# Patient Record
Sex: Female | Born: 1960 | Race: White | Hispanic: No | Marital: Married | Smoking: Never smoker
Health system: Southern US, Community
[De-identification: ages and names within clinical notes are randomized; demographics above are authoritative.]

## PROBLEM LIST (undated history)

## (undated) ENCOUNTER — Ambulatory Visit (HOSPITAL_COMMUNITY): Admission: EM | Payer: Commercial Managed Care - HMO | Source: Home / Self Care

## (undated) DIAGNOSIS — L100 Pemphigus vulgaris: Secondary | ICD-10-CM

## (undated) DIAGNOSIS — Z973 Presence of spectacles and contact lenses: Secondary | ICD-10-CM

## (undated) DIAGNOSIS — K210 Gastro-esophageal reflux disease with esophagitis: Secondary | ICD-10-CM

## (undated) DIAGNOSIS — E559 Vitamin D deficiency, unspecified: Secondary | ICD-10-CM

## (undated) DIAGNOSIS — M1990 Unspecified osteoarthritis, unspecified site: Secondary | ICD-10-CM

## (undated) DIAGNOSIS — S83207A Unspecified tear of unspecified meniscus, current injury, left knee, initial encounter: Secondary | ICD-10-CM

## (undated) HISTORY — DX: Vitamin D deficiency, unspecified: E55.9

## (undated) HISTORY — DX: Pemphigus vulgaris: L10.0

## (undated) HISTORY — DX: Gastro-esophageal reflux disease with esophagitis: K21.0

---

## 1999-01-28 ENCOUNTER — Other Ambulatory Visit: Admission: RE | Admit: 1999-01-28 | Discharge: 1999-01-28 | Payer: Self-pay | Admitting: Obstetrics and Gynecology

## 1999-11-21 ENCOUNTER — Ambulatory Visit (HOSPITAL_COMMUNITY): Admission: RE | Admit: 1999-11-21 | Discharge: 1999-11-21 | Payer: Self-pay | Admitting: Obstetrics and Gynecology

## 1999-11-21 ENCOUNTER — Encounter (INDEPENDENT_AMBULATORY_CARE_PROVIDER_SITE_OTHER): Payer: Self-pay

## 1999-11-21 HISTORY — PX: OTHER SURGICAL HISTORY: SHX169

## 2000-02-11 ENCOUNTER — Other Ambulatory Visit: Admission: RE | Admit: 2000-02-11 | Discharge: 2000-02-11 | Payer: Self-pay | Admitting: Obstetrics and Gynecology

## 2000-02-24 ENCOUNTER — Ambulatory Visit (HOSPITAL_COMMUNITY): Admission: RE | Admit: 2000-02-24 | Discharge: 2000-02-24 | Payer: Self-pay | Admitting: Obstetrics and Gynecology

## 2000-02-24 ENCOUNTER — Encounter: Payer: Self-pay | Admitting: Obstetrics and Gynecology

## 2001-08-03 ENCOUNTER — Other Ambulatory Visit: Admission: RE | Admit: 2001-08-03 | Discharge: 2001-08-03 | Payer: Self-pay | Admitting: Obstetrics and Gynecology

## 2001-08-25 ENCOUNTER — Encounter: Payer: Self-pay | Admitting: Obstetrics and Gynecology

## 2001-08-25 ENCOUNTER — Ambulatory Visit (HOSPITAL_COMMUNITY): Admission: RE | Admit: 2001-08-25 | Discharge: 2001-08-25 | Payer: Self-pay | Admitting: Obstetrics and Gynecology

## 2001-10-02 ENCOUNTER — Emergency Department (HOSPITAL_COMMUNITY): Admission: EM | Admit: 2001-10-02 | Discharge: 2001-10-03 | Payer: Self-pay

## 2002-12-21 ENCOUNTER — Encounter: Payer: Self-pay | Admitting: Obstetrics and Gynecology

## 2002-12-21 ENCOUNTER — Ambulatory Visit (HOSPITAL_COMMUNITY): Admission: RE | Admit: 2002-12-21 | Discharge: 2002-12-21 | Payer: Self-pay | Admitting: Obstetrics and Gynecology

## 2003-03-23 ENCOUNTER — Other Ambulatory Visit: Admission: RE | Admit: 2003-03-23 | Discharge: 2003-03-23 | Payer: Self-pay | Admitting: Obstetrics and Gynecology

## 2003-05-29 ENCOUNTER — Ambulatory Visit (HOSPITAL_COMMUNITY): Admission: RE | Admit: 2003-05-29 | Discharge: 2003-05-29 | Payer: Self-pay | Admitting: Obstetrics and Gynecology

## 2004-03-06 ENCOUNTER — Ambulatory Visit (HOSPITAL_COMMUNITY): Admission: RE | Admit: 2004-03-06 | Discharge: 2004-03-06 | Payer: Self-pay | Admitting: Obstetrics and Gynecology

## 2004-06-20 ENCOUNTER — Other Ambulatory Visit: Admission: RE | Admit: 2004-06-20 | Discharge: 2004-06-20 | Payer: Self-pay | Admitting: Obstetrics and Gynecology

## 2005-04-30 ENCOUNTER — Ambulatory Visit (HOSPITAL_COMMUNITY): Admission: RE | Admit: 2005-04-30 | Discharge: 2005-04-30 | Payer: Self-pay | Admitting: Obstetrics and Gynecology

## 2005-07-16 ENCOUNTER — Other Ambulatory Visit: Admission: RE | Admit: 2005-07-16 | Discharge: 2005-07-16 | Payer: Self-pay | Admitting: Obstetrics and Gynecology

## 2006-05-18 ENCOUNTER — Ambulatory Visit (HOSPITAL_COMMUNITY): Admission: RE | Admit: 2006-05-18 | Discharge: 2006-05-18 | Payer: Self-pay | Admitting: Obstetrics and Gynecology

## 2007-05-25 ENCOUNTER — Ambulatory Visit (HOSPITAL_COMMUNITY): Admission: RE | Admit: 2007-05-25 | Discharge: 2007-05-25 | Payer: Self-pay | Admitting: Obstetrics and Gynecology

## 2008-06-07 ENCOUNTER — Ambulatory Visit (HOSPITAL_COMMUNITY): Admission: RE | Admit: 2008-06-07 | Discharge: 2008-06-07 | Payer: Self-pay | Admitting: Obstetrics and Gynecology

## 2009-10-24 ENCOUNTER — Ambulatory Visit (HOSPITAL_COMMUNITY): Admission: RE | Admit: 2009-10-24 | Discharge: 2009-10-24 | Payer: Self-pay | Admitting: Obstetrics and Gynecology

## 2009-12-12 ENCOUNTER — Ambulatory Visit: Payer: Self-pay | Admitting: Physician Assistant

## 2010-04-11 ENCOUNTER — Ambulatory Visit: Payer: Self-pay | Admitting: Physician Assistant

## 2010-05-20 ENCOUNTER — Ambulatory Visit: Payer: Self-pay | Admitting: Family Medicine

## 2010-07-07 ENCOUNTER — Encounter: Payer: Self-pay | Admitting: Obstetrics and Gynecology

## 2010-08-19 ENCOUNTER — Ambulatory Visit (INDEPENDENT_AMBULATORY_CARE_PROVIDER_SITE_OTHER): Payer: 59 | Admitting: Family Medicine

## 2010-08-19 DIAGNOSIS — E7800 Pure hypercholesterolemia, unspecified: Secondary | ICD-10-CM

## 2010-08-19 DIAGNOSIS — D649 Anemia, unspecified: Secondary | ICD-10-CM

## 2010-08-19 DIAGNOSIS — F329 Major depressive disorder, single episode, unspecified: Secondary | ICD-10-CM

## 2010-08-19 DIAGNOSIS — F3289 Other specified depressive episodes: Secondary | ICD-10-CM

## 2010-11-01 NOTE — H&P (Signed)
[facility]  Patient:    Margaret Ramirez, Margaret Ramirez                       MRN: [id number] Adm. Date:  11/21/99 Attending:  Erie Noe P. Pennie Rushing, M.D.                         History and Physical  HISTORY OF PRESENT ILLNESS:   The patient is a 50 year old married white female, para 2-0-0-2, who presents for evaluation and treatment of a right ovarian cyst. The patient has had a right ovarian cyst noted on ultrasound since March of 2001, at which time it measured at a 1.7 cm simple cyst.  Repeat ultrasound Oct 21, 1999, showed a 2.9 cm simple right ovarian cyst.  Since that time, the patient has had right lower quadrant pain which has become progressive such that she has it on  daily basis at this time.  She also complains of dyspareunia.  She is without dysuria, constipation, diarrhea, fever, or chills.  She has also noticed an increase in the intensity of her menstrual cramps over the last two months. Her current medications include Lo-Ovral and that is her only medication.  Last menstrual period was Oct 29, 1999.  Periods are regular.  PAST MEDICAL HISTORY:         GYN; Negative.  Obstetrical; 1982 vaginal delivery of a 10 pound 4 ounce female infant.  1992 primary low transverse cesarean section for nonreassuring fetal heart rate tracing, delivering an 8 pound 8 ounce female infant. Medical; The patient has recently been evaluated for an orthopedic problem which did not require surgery only physical therapy.  MEDICATIONS:                  Lo-Ovral.  ALLERGIES:                    CEFZIL.  FAMILY HISTORY:               Negative.  REVIEW OF SYSTEMS:            As noted above.  PHYSICAL EXAMINATION:  VITAL SIGNS:                  Blood pressure 110/70.  LUNGS:                        Clear.  HEART:                        Regular rate and rhythm.  ABDOMEN:                      Obese without palpable masses or organomegaly.  PELVIC:                        EGBUS within normal limits.  The vagina is rugous. The cervix is without gross lesions.  The uterus is normal size, shape, consistency, anterior, mobile, and nontender.  Left adnexa contains no masses or tenderness.  The right adnexa contains a 4 cm fullness which is mildly tender.  IMPRESSION:                   Persistent right ovarian cyst which is symptomatic.  DISPOSITION:  A discussion is held with the patient concerning options for management and she wishes to undergo surgical management of her right adnexal mass.  She will thus undergo laparoscopy, possible laparotomy for removal of the right ovarian cyst, and possible right oophorectomy.  The risks of anesthesia, bleeding, infection, and damage to adjacent organs have all been explained and the patient seems to understand and wishes to proceed.  This will be performed at [facility] [place] on November 21, 1999. DD:  11/13/99 TD:  11/13/99 Job: [id number]

## 2010-11-01 NOTE — Op Note (Signed)
[facility]  Patient:    Margaret Ramirez, Margaret Ramirez                     MRN: [id number] Proc. Date: 11/21/99 Adm. Date:  [id number] Attending:  Dierdre Forth Pearline                           Operative Report  PREOPERATIVE DIAGNOSIS:       Persistent right ovarian cyst.  Pelvic pain.  POSTOPERATIVE DIAGNOSIS:      Right ovarian cyst, probable corpus luteum.  Rule out endometriosis.  OPERATION:                    Operative laparoscopy with right ovarian cystectomy.  SURGEON:                      Vanessa P. Haygood, M.D.  ASSISTANT:  ANESTHESIA:                   General orotracheal.  ESTIMATED BLOOD LOSS:         Less than 25 cc.  COMPLICATIONS:                None.  FINDINGS:                     The uterus was upper limits of normal size.  The tubes were normal bilaterally.  The left ovary was essentially normal size. The right ovary was upper limits of normal size with an apparent corpus luteum cyst  which was persistent with a powderburn lesion over it, question endometriosis. No other areas of adhesions or endometriosis were noted.  DESCRIPTION OF PROCEDURE:     The patient was taken to the operating room after  appropriate identification and placed on the operating table.  After the attainment of adequate general anesthesia, she was placed in the modified lithotomy position. The abdomen, perineum, and vagina were prepped with multiple layers of Betadine and a Foley catheter inserted into the bladder and connected to straight drainage. A single tooth tenaculum was placed on the anterior cervix.  The abdomen was draped as a sterile field.  The subumbilical area was infiltrated with a 0.25% Marcaine solution for approximately 5 cc and another 5 cc used to infiltrate the right and left suprapubic regions.  A subumbilical incision was made and a Veress cannula  placed through that incision into the peritoneal cavity.  A  pneumoperitoneum was created with 3 liters of CO2.  The Veress cannula was removed and the laparoscopic trocar placed through that incision into the peritoneal cavity.  The laparoscope was placed through the trocar sleeve.  Suprapubic incisions were made to the right and left of midling and laparoscopic probe trocars placed through those incisions into the peritoneal cavity under direct visualization.  The above noted findings were made and documented and the right ovary grasped with a self retaining grasper. The Harmonic scalpel was used to incise the ovarian cortex capsule and the cyst  hydrodissected with the Nezhat suction irrigator.  With a combination of traction and hydrodissection, the ovarian cyst was removed in pieces.  The ovary was then copiously irrigated and hemostasis noted to be adequate.  All pieces of the ovarian cyst were then removed from the peritoneal cavity and approximately 60 cc of warm Ringers lactate left in the pelvis.  All instruments were then removed from the  peritoneal cavity under direct visualization as the CO2 was allowed to escape. The subumbilical and suprapubic incisions were closed with subcuticular sutures of -0 Vicryl.  Sterile dressings were applied, the single tooth tenaculum and Foley removed, and the patient awakened from general anesthesia and taken to the recovery room in satisfactory condition having tolerated the procedure well with sponge,  needle, and instrument counts were correct.  Specimen to pathology was ovarian yst wall, rule out endometriosis. DD:  11/21/99 TD:  11/22/99 Job: [id number]

## 2010-11-14 ENCOUNTER — Encounter: Payer: Self-pay | Admitting: *Deleted

## 2010-11-18 ENCOUNTER — Ambulatory Visit (INDEPENDENT_AMBULATORY_CARE_PROVIDER_SITE_OTHER): Payer: 59 | Admitting: Family Medicine

## 2010-11-18 ENCOUNTER — Other Ambulatory Visit: Payer: Self-pay | Admitting: Family Medicine

## 2010-11-18 DIAGNOSIS — Z0000 Encounter for general adult medical examination without abnormal findings: Secondary | ICD-10-CM

## 2010-11-18 LAB — CBC WITH DIFFERENTIAL/PLATELET
Basophils Absolute: 0.1 10*3/uL (ref 0.0–0.1)
Basophils Relative: 2 % — ABNORMAL HIGH (ref 0–1)
Eosinophils Absolute: 0.2 10*3/uL (ref 0.0–0.7)
Eosinophils Relative: 3 % (ref 0–5)
HCT: 33.5 % — ABNORMAL LOW (ref 36.0–46.0)
Hemoglobin: 10.6 g/dL — ABNORMAL LOW (ref 12.0–15.0)
MCH: 28 pg (ref 26.0–34.0)
Neutro Abs: 3.1 10*3/uL (ref 1.7–7.7)
Platelets: 303 10*3/uL (ref 150–400)
WBC: 5.7 10*3/uL (ref 4.0–10.5)

## 2010-11-18 LAB — GLUCOSE, RANDOM: Glucose, Bld: 93 mg/dL (ref 70–99)

## 2010-11-18 LAB — LIPID PANEL: HDL: 62 mg/dL (ref >39)

## 2010-11-20 ENCOUNTER — Encounter: Payer: Self-pay | Admitting: Family Medicine

## 2010-11-20 ENCOUNTER — Ambulatory Visit (INDEPENDENT_AMBULATORY_CARE_PROVIDER_SITE_OTHER): Payer: 59 | Admitting: Family Medicine

## 2010-11-20 VITALS — BP 100/68 | HR 72 | Ht 68.0 in | Wt 232.0 lb

## 2010-11-20 DIAGNOSIS — D649 Anemia, unspecified: Secondary | ICD-10-CM

## 2010-11-20 DIAGNOSIS — F4322 Adjustment disorder with anxiety: Secondary | ICD-10-CM

## 2010-11-20 DIAGNOSIS — Z0000 Encounter for general adult medical examination without abnormal findings: Secondary | ICD-10-CM

## 2010-11-20 LAB — FOLATE: Folate: 4 ng/mL

## 2010-11-20 LAB — POCT URINALYSIS DIPSTICK
Bilirubin, UA: NEGATIVE
Glucose, UA: NEGATIVE
Leukocytes, UA: NEGATIVE
Spec Grav, UA: 1.005
Urobilinogen, UA: NEGATIVE
pH, UA: 5

## 2010-11-20 LAB — VITAMIN B12: Vitamin B-12: 345 pg/mL (ref 211–911)

## 2010-11-20 LAB — IRON: Iron: 44 ug/dL (ref 42–145)

## 2010-11-20 LAB — FERRITIN: Ferritin: 26 ng/mL (ref 10–291)

## 2010-11-20 MED ORDER — CITALOPRAM HYDROBROMIDE 10 MG PO TABS: 10.0000 mg | ORAL_TABLET | Freq: Every day | ORAL | Status: DC

## 2010-11-20 NOTE — Progress Notes (Signed)
Margaret Ramirez in lab added iron,ferritin, B12 and folate (dx. 285.9)

## 2010-11-20 NOTE — Patient Instructions (Signed)
Please look into finding out when your last tetanus shot was, and calling us with the date. Thanks

## 2010-11-20 NOTE — Progress Notes (Signed)
Subjective:    Patient ID: Margaret Ramirez, female    DOB: 05-27-1961, 50 y.o.   MRN: [id number]  HPI Margaret Ramirez is a 50 y.o. female who presents for a complete physical. She sees Dr. Pennie Rushing for her GYN care, appt is tomorrow. She has the following concerns:  Needs refill on Celexa.  Tried cutting back, but had recurrent anxiety.  Doing well now, on 10mg  dose.  Only very rarely needs to use alprazolam  There is no immunization history on file for this patient. Patient believes she had a tetanus shot at physical at [facility] in 2007 Last Pap smear: 1 year ago Last mammogram: 1 year ago Last colonoscopy: never Last DEXA: N/A Dentist: twice yearly Ophtho: once a year Exercise: recently started doing Zumba and going to the [facility]   Review of Systems The patient denies anorexia, fever, weight changes, headaches,  vision changes, decreased hearing, ear pain, sore throat, breast concerns, chest pain, palpitations, dizziness, syncope, dyspnea on exertion, cough, swelling, nausea, vomiting, diarrhea, constipation, abdominal pain, melena, hematochezia, indigestion/heartburn, hematuria, incontinence, dysuria, irregular menstrual cycles, vaginal discharge, odor or itch, genital lesions, joint pains, numbness, tingling, weakness, tremor, suspicious skin lesions, depression, anxiety, abnormal bleeding/bruising, or enlarged lymph nodes.  Some nasal congestion and bloody noses recently, improved now.  OTC Zyrtec helps.  Some sleep issues, but relates it to working 3rd shift.  Going back to 1st shift in August    Objective:   Physical Exam BP 100/68  Pulse 72  Ht 5\' 8"  (1.727 m)  Wt 232 lb (105.235 kg)  BMI 35.28 kg/m2  LMP 11/19/2008  General Appearance:    Alert, cooperative, no distress, appears stated age  Head:    Normocephalic, without obvious abnormality, atraumatic  Eyes:    PERRL, conjunctiva/corneas clear, EOM's intact, fundi    benign  Ears:    Normal TM's and external ear  canals  Nose:   Nares normal, mucosa normal, no drainage or sinus   tenderness  Throat:   Lips, mucosa, and tongue normal; teeth and gums normal  Neck:   Supple, no lymphadenopathy;  thyroid:  no   enlargement/tenderness/nodules; no carotid   bruit or JVD  Back:    Spine nontender, no curvature, ROM normal, no CVA     tenderness  Lungs:     Clear to auscultation bilaterally without wheezes, rales or     ronchi; respirations unlabored  Chest Wall:    No tenderness or deformity   Heart:    Regular rate and rhythm, S1 and S2 normal, no murmur, rub   or gallop  Breast Exam:    Deferred to GYN  Abdomen:     Soft, non-tender, nondistended, normoactive bowel sounds,    no masses, no hepatosplenomegaly  Genitalia:    Deferred to GYN     Extremities:   No clubbing, cyanosis or edema  Pulses:   2+ and symmetric all extremities  Skin:   Skin color, texture, turgor normal, no rashes or lesions  Lymph nodes:   Cervical, supraclavicular, and axillary nodes normal  Neurologic:   CNII-XII intact, normal strength, sensation and gait; reflexes 2+ and symmetric throughout          Psych:   Normal mood, affect, hygiene and grooming.    Recent Labs  Basename 11/18/10 1044   HGB 10.6*   Recent Labs  Basename 11/18/10 1044   HCT 33.5*   Recent Labs  Basename 11/18/10 1044   CHOL 214*  See lab section for full lipid panel and CBC     Assessment & Plan:   1. Routine general medical examination at a health care facility  POCT urinalysis dipstick, Visual acuity screening, Ambulatory referral to Gastroenterology  2. Anemia, unspecified  Ambulatory referral to Gastroenterology  3. Adjustment disorder with anxious mood  citalopram (CELEXA) 10 MG tablet   improved    Discussed monthly self breast exams and yearly mammograms after the age of 71; at least 30 minutes of aerobic activity at least 5 days/week; proper sunscreen use reviewed; healthy diet, including goals of calcium and vitamin D intake and  alcohol recommendations (less than or equal to 1 drink/day) reviewed; regular seatbelt use; changing batteries in smoke detectors.  Immunization recommendations discussed.  Colonoscopy recommendations reviewed--referral done  Discussed restarting Weight Watchers

## 2010-11-21 ENCOUNTER — Telehealth: Payer: Self-pay | Admitting: *Deleted

## 2010-11-21 NOTE — Telephone Encounter (Signed)
Patient notifed of labs. Informed B12 normal. Informed folate and iron borderline. Rec OTC iron and 1mg  of folic acid. Pt scg for 12/23/10 at 8:25a, for cbc recheck.

## 2010-12-05 NOTE — Progress Notes (Signed)
Can you close this open chart please? Thanks.

## 2010-12-15 DIAGNOSIS — K2100 Gastro-esophageal reflux disease with esophagitis, without bleeding: Secondary | ICD-10-CM

## 2010-12-15 HISTORY — DX: Gastro-esophageal reflux disease with esophagitis, without bleeding: K21.00

## 2010-12-23 ENCOUNTER — Ambulatory Visit (INDEPENDENT_AMBULATORY_CARE_PROVIDER_SITE_OTHER): Payer: 59 | Admitting: Family Medicine

## 2010-12-23 ENCOUNTER — Encounter: Payer: Self-pay | Admitting: Family Medicine

## 2010-12-23 ENCOUNTER — Other Ambulatory Visit: Payer: 59

## 2010-12-23 VITALS — BP 108/70 | HR 72 | Temp 98.4°F | Ht 69.0 in | Wt 231.0 lb

## 2010-12-23 DIAGNOSIS — D649 Anemia, unspecified: Secondary | ICD-10-CM

## 2010-12-23 DIAGNOSIS — J069 Acute upper respiratory infection, unspecified: Secondary | ICD-10-CM

## 2010-12-23 DIAGNOSIS — L255 Unspecified contact dermatitis due to plants, except food: Secondary | ICD-10-CM

## 2010-12-23 DIAGNOSIS — L237 Allergic contact dermatitis due to plants, except food: Secondary | ICD-10-CM

## 2010-12-23 LAB — CBC WITH DIFFERENTIAL/PLATELET
MCV: 90.7 fL (ref 78.0–100.0)
Monocytes Absolute: 0.6 10*3/uL (ref 0.1–1.0)
RDW: 16 % — ABNORMAL HIGH (ref 11.5–15.5)

## 2010-12-23 LAB — FERRITIN: Ferritin: 33 ng/mL (ref 10–291)

## 2010-12-23 LAB — FOLATE: Folate: 4.4 ng/mL

## 2010-12-23 MED ORDER — PREDNISONE 10 MG PO KIT: PACK | ORAL | Status: DC

## 2010-12-23 NOTE — Progress Notes (Signed)
Patient is seen today for evaluation of rash on arms, neck, right eye.  Had exposure to poison ivy over the weekend while doing yardwork.  Started on wrist, spread up arm since exposure 2 days ago.  Yesterday spread to R neck.  Then also noted R eye swelling, and spread to abdomen.   Also complains of cough.  3 days ago started with sore throat, then spread to cough, having postnasal drip.  Phlegm is green in am's, but clear throughout the day.  Nasal mucus is clear during day (but discolored in mornings).  Denies sinus pain/pressure today, but some headache yesterday.  Used Advil for headache yesterday.  Using Caladryl lotion on rash.  Patient also presents for labwork--due for 1 month re-check on anemia.  Was noted to have borderline iron and folate levels, as well as worsening anemia.  Has been taking OTC iron 2 tablets daily.  Also started taking multivitamin (which contains folate).  In the interim, she was referred for colonoscopy.  Has seen Dr. Loreta Ave, who did bloodwork which suggested a gluten sensitivity.  She was told not to change her diet until after her colonoscopy next month (will be doing biopsy).  Past Medical History  Diagnosis Date  . Adjustment disorder with anxiety   . Sinus bradycardia   . Anemia     Past Surgical History  Procedure Date  . Varicose vein surgery 2012    Dr. Guss Bunde  . Cesarean section     History   Social History  . Marital Status: Married    Spouse Name: N/A    Number of Children: 2  . Years of Education: N/A   Occupational History  . 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 Smoking status: Never Smoker   . Smokeless tobacco: Never Used  . Alcohol Use: No  . Drug Use: No  . Sexually Active: Yes    Birth Control/ Protection: Post-menopausal   Other Topics Concern  . Not on file   Social History Narrative  . No narrative on file    Family History  Problem Relation Age of Onset  . Cancer  Mother     lung  . COPD Father   . Hyperlipidemia Sister   . Hyperlipidemia Brother   . Hypertension Brother   . Diabetes Neg Hx    Current Outpatient Prescriptions on File Prior to Visit  Medication Sig Dispense Refill  . ALPRAZolam (XANAX) 0.5 MG tablet Take 0.5 mg by mouth as needed.        . citalopram (CELEXA) 10 MG tablet Take 1 tablet (10 mg total) by mouth daily.  90 tablet  3    Allergies  Allergen Reactions  . Cefzil Hives and Rash   ROS:  Denies fevers (some sweats yesterday).  No constipation or other GI complaints.  +URI +rash--see HPI.  Denies fatigue, shortness of breath, chest pain  PHYSICAL EXAM: BP 108/70  Pulse 72  Temp 98.4 F (36.9 C)  Ht 5\' 9"  (1.753 m)  Wt 231 lb (104.781 kg)  BMI 34.11 kg/m2 Well developed female, frequently coughing, no distress HEENT:  PERRL, EOMI, conjunctiva clear, some soft tissue swelling at lateral aspect of R eye.  Nasal mucosa with mild edema, no erythema, no purulence.  Sinuses nontender.  OP clear, without erythema or cobblestoning. Neck: no lymphadenopathy or mass Heart:  Regular rate and rhythm without murmur Lungs: clear bilaterally Extremities: no edema Skin: vesicular rash and erythema over forarms (  almost a plaque formation on R, confluence of lesions; linear vesicles noted on L), scattered vesicular eruptions on neck, chest  ASSESSMENT/PLAN: 1. Poison ivy dermatitis  PredniSONE 10 MG KIT  2. URI (upper respiratory infection)    3. Anemia, unspecified  CBC with Differential, Ferritin, Folate   Risks and side effects of prednisone reviewed.  Discussed preventative measures for future. Discussed supportive measures for URI--use of decongestants and Mucinex prn.  Discussed signs/symptoms of bacterial infection, and to f/u or call for ABX if symptoms persist or worsen  Colonoscopy scheduled for 8/17

## 2010-12-25 ENCOUNTER — Other Ambulatory Visit: Payer: Self-pay | Admitting: *Deleted

## 2010-12-25 ENCOUNTER — Telehealth: Payer: Self-pay | Admitting: Family Medicine

## 2010-12-25 DIAGNOSIS — D649 Anemia, unspecified: Secondary | ICD-10-CM

## 2010-12-25 MED ORDER — BENZONATATE 200 MG PO CAPS: 200.0000 mg | ORAL_CAPSULE | Freq: Three times a day (TID) | ORAL | Status: AC | PRN

## 2010-12-25 MED ORDER — AZITHROMYCIN 250 MG PO TABS: ORAL_TABLET | ORAL | Status: AC

## 2010-12-25 NOTE — Telephone Encounter (Signed)
Left message for patient to return my call.

## 2010-12-25 NOTE — Telephone Encounter (Signed)
Advise pt that z-pak and Tessalon was e-rx'd to her pharmacy

## 2010-12-25 NOTE — Telephone Encounter (Signed)
Pt called states cold symptoms are worse. Cough worse and now has greenish color cong wants rx antibiotic and cough rx

## 2010-12-25 NOTE — Telephone Encounter (Signed)
°  Pt called states cold symptoms are worse. Cough worse and now has greenish color cong wants rx antibiotic and cough rx  [facility]

## 2011-01-31 HISTORY — PX: COLONOSCOPY WITH ESOPHAGOGASTRODUODENOSCOPY (EGD): SHX5779

## 2011-01-31 LAB — HM COLONOSCOPY: HM Colonoscopy: NORMAL

## 2011-02-03 ENCOUNTER — Encounter: Payer: Self-pay | Admitting: Family Medicine

## 2011-02-03 DIAGNOSIS — K2100 Gastro-esophageal reflux disease with esophagitis, without bleeding: Secondary | ICD-10-CM | POA: Insufficient documentation

## 2011-02-03 DIAGNOSIS — K219 Gastro-esophageal reflux disease without esophagitis: Secondary | ICD-10-CM | POA: Insufficient documentation

## 2011-03-05 ENCOUNTER — Other Ambulatory Visit (HOSPITAL_COMMUNITY): Payer: Self-pay | Admitting: Obstetrics and Gynecology

## 2011-03-05 DIAGNOSIS — Z1231 Encounter for screening mammogram for malignant neoplasm of breast: Secondary | ICD-10-CM

## 2011-03-14 ENCOUNTER — Ambulatory Visit (HOSPITAL_COMMUNITY)
Admission: RE | Admit: 2011-03-14 | Discharge: 2011-03-14 | Disposition: A | Discharge status: Home / Self Care | Payer: 59 | Source: Ambulatory Visit | Attending: Obstetrics and Gynecology | Admitting: Obstetrics and Gynecology

## 2011-03-14 DIAGNOSIS — Z1231 Encounter for screening mammogram for malignant neoplasm of breast: Secondary | ICD-10-CM

## 2011-04-02 ENCOUNTER — Other Ambulatory Visit: Payer: Self-pay | Admitting: Family Medicine

## 2011-04-02 DIAGNOSIS — F411 Generalized anxiety disorder: Secondary | ICD-10-CM

## 2011-04-02 MED ORDER — ALPRAZOLAM 0.5 MG PO TABS: 0.5000 mg | ORAL_TABLET | ORAL | Status: DC | PRN

## 2011-04-02 NOTE — Telephone Encounter (Signed)
Phoned [facility] refilled Xanax .5mg  #30 0 ref

## 2011-04-02 NOTE — Telephone Encounter (Signed)
Please phone in Rx to [facility].  Sign Rx when done.  Thanks

## 2011-04-28 ENCOUNTER — Other Ambulatory Visit: Payer: 59

## 2011-07-17 ENCOUNTER — Encounter: Payer: Self-pay | Admitting: Family Medicine

## 2011-07-17 ENCOUNTER — Ambulatory Visit (INDEPENDENT_AMBULATORY_CARE_PROVIDER_SITE_OTHER): Payer: 59 | Admitting: Family Medicine

## 2011-07-17 DIAGNOSIS — K120 Recurrent oral aphthae: Secondary | ICD-10-CM

## 2011-07-17 DIAGNOSIS — L259 Unspecified contact dermatitis, unspecified cause: Secondary | ICD-10-CM

## 2011-07-17 DIAGNOSIS — IMO0002 Reserved for concepts with insufficient information to code with codable children: Secondary | ICD-10-CM

## 2011-07-17 DIAGNOSIS — L309 Dermatitis, unspecified: Secondary | ICD-10-CM

## 2011-07-17 MED ORDER — DOXYCYCLINE HYCLATE 100 MG PO TABS: 100.0000 mg | ORAL_TABLET | Freq: Two times a day (BID) | ORAL | Status: AC

## 2011-07-17 NOTE — Patient Instructions (Signed)
Follow up with your dentist regarding the gum lesions. Take the antibiotics as directed. Warm soaks 3-4 times daily (L toe) If the skin lesion on chest isn't improving with the antibiotics, then I recommend using a topical antifungal (ie Lotrimin or other athlete's foot medications)--twice daily for up to 2-3 weeks.  Avoid direct pressure over the skin lesion to allow healing.  Re-start weight watchers, and check with your insurance if they have any wellness programs or weight loss resources

## 2011-07-17 NOTE — Progress Notes (Signed)
Chief complaint: has had sore on her gums since November that will not heal, has seen dentist twice for this reason. Has a spot on her chest that will not heal, noticed it in December. L big toe has been infected twice, once in Dec and is infected now. Also would like a referral to a nutritionist to help with weight loss.   HPI:   Mouth sores:  Seem different than her previous canker sores.  Has noticed since November.  Dentist had given her some orajel which provides temporary relief.  Sores bilaterally on gums on lower jaw.  The lesion on the left side has been present for about 3 weeks.  The lesion on the right has been present since November.  Tried different tooth pastes, flossing, but hasn't helped. Bleeds easily.  Sensitive to cold temperatures.  Not bothered by soda; avoids acidic foods.  No h/o tobacco use.  Recently also developed a canker sore on the right.  Nonhealing lesion between her breasts, on the left, present x 4-5 weeks.  Painful, feels sore, not itchy.  Used neosporin without benefit.  Began as a perfectly round circle that she scratched.  Increasing in size.  Has a dog.  No other contacts with rashes.  L great toe--swelling and redness around the nail, oozing for a few days.  Had previous infection in same area in December, which drained and resolved on its own, although the swollen area at base of nail never improved, but the redness resolved.  Noticed increase in redness and pain over the last few days.  Less painful since draining this morning.  Past Medical History  Diagnosis Date  . Adjustment disorder with anxiety   . Sinus bradycardia   . Anemia     Past Surgical History  Procedure Date  . Varicose vein surgery 2012    Dr. Guss Bunde  . Cesarean section   . Colonoscopy 01/31/11    normal; Dr. Loreta Ave  . Esophagogastroduodenoscopy 01/31/11    Dr. Loreta Ave; distal esophagitis    History   Social History  . Marital Status: Married    Spouse Name: N/A    Number of  Children: 2  . Years of Education: N/A   Occupational History  . 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 Smoking status: Never Smoker   . Smokeless tobacco: Never Used  . Alcohol Use: No  . Drug Use: No  . Sexually Active: Yes    Birth Control/ Protection: Post-menopausal   Other Topics Concern  . Not on file   Social History Narrative  . No narrative on file    Family History  Problem Relation Age of Onset  . Cancer Mother     lung  . COPD Father   . Hyperlipidemia Sister   . Hyperlipidemia Brother   . Hypertension Brother   . Diabetes Neg Hx    Current Outpatient Prescriptions on File Prior to Visit  Medication Sig Dispense Refill  . ALPRAZolam (XANAX) 0.5 MG tablet Take 1 tablet (0.5 mg total) by mouth as needed.  30 tablet  0  . Multiple Vitamin (MULTIVITAMIN) tablet Take 1 tablet by mouth daily.          Allergies  Allergen Reactions  . Cefzil Hives and Rash   ROS:  Denies fevers, headaches, dizziness, chest pain, URI symptoms or other concerns.  See HPI.  PHYSICAL EXAM: BP 108/72  Pulse 72  Ht 5\' 8"  (1.727 m)  Wt  235 lb (106.595 kg)  BMI 35.73 kg/m2  LMP 11/19/2008 Well developed female in no distress Mouth--L side, there is an ulceration on lower gums between 2 teeth. No surrounding swelling, or erythema of gum.  R lower jaw there is an aphthous ulcer (fairly recent per pt) and a larger punched out/ulcerated lesion extending below an entire tooth.  There is some inflammation, and whiteness at the border  Chest--under L breast, medially (at a location that might be irritated by an underwire) that is erythematous, open centrally, slightly macerated.  No surrounding erythema, soft tissue swelling or pustules.    L great toe--soft tissue swelling at base of nail, mostly medially.  Some crusting at nail where drained earlier in day.  +erythema over swollen area.  No red streaks.  + trace swelling pretibially. 2+  pulses  Psych: normal mood, affect  ASSESSMENT/PLAN: 1. Paronychia  doxycycline (VIBRA-TABS) 100 MG tablet  2. Dermatitis    3. Aphthous ulcer     Paronychia without ingrown nail.  Warm soaks, antibiotics.  Return if not resolving.  Skin lesion on chest--? Etiology.  Doxycycline will cover a bacterial component.  There isn't any central clearing to suggest ringworm, but macerated appearance suggests possible fungal component. If not improving with antibiotics, and prevention of further irritation/injury, then start OTC antifungal creams.  Follow up if not resolving  Aphthous ulcer--reassured; recent onset  Supportive measures  Obesity.  Check with insurance re: weight loss options.  Re-start Weight Watchers

## 2011-07-25 ENCOUNTER — Telehealth: Payer: Self-pay | Admitting: Internal Medicine

## 2011-07-25 NOTE — Telephone Encounter (Signed)
Per Dr. Susann Givens, pt was to stop the medicine. I informed the pt that she would need to make an appt to come in Monday to be treated for her hives from the possible allergic reaction and to get put on something else. Pt made appt 07/28/11 to come in to see Dr. Lynelle Doctor

## 2011-07-25 NOTE — Telephone Encounter (Signed)
pt came in last thursday and was put on doxcycline 100mg  twice a day for 10 days she has about 4 days left and noticied she she had hives on both arms and torso but no fever or no nausea. she has not taking it today and wants to know if she should stop taking it or continue on it?. Please call

## 2011-07-28 ENCOUNTER — Ambulatory Visit: Payer: 59 | Admitting: Family Medicine

## 2011-08-05 ENCOUNTER — Other Ambulatory Visit: Payer: Self-pay | Admitting: Internal Medicine

## 2011-08-06 MED ORDER — ALPRAZOLAM 0.5 MG PO TABS: 0.5000 mg | ORAL_TABLET | ORAL | Status: DC | PRN

## 2011-08-06 NOTE — Telephone Encounter (Signed)
Okay for refill.  Please phone in

## 2011-08-25 ENCOUNTER — Ambulatory Visit (INDEPENDENT_AMBULATORY_CARE_PROVIDER_SITE_OTHER): Payer: 59 | Admitting: Medical

## 2011-08-25 ENCOUNTER — Encounter: Payer: Self-pay | Admitting: Medical

## 2011-08-25 VITALS — BP 122/80 | HR 82 | Temp 98.4°F | Resp 16 | Wt 232.0 lb

## 2011-08-25 DIAGNOSIS — J40 Bronchitis, not specified as acute or chronic: Secondary | ICD-10-CM

## 2011-08-25 DIAGNOSIS — J329 Chronic sinusitis, unspecified: Secondary | ICD-10-CM

## 2011-08-25 MED ORDER — HYDROCODONE-HOMATROPINE 5-1.5 MG/5ML PO SYRP: 5.0000 mL | ORAL_SOLUTION | Freq: Three times a day (TID) | ORAL | Status: AC | PRN

## 2011-08-25 MED ORDER — AZITHROMYCIN 500 MG PO TABS: ORAL_TABLET | ORAL | Status: DC

## 2011-08-25 NOTE — Patient Instructions (Signed)

## 2011-08-25 NOTE — Progress Notes (Signed)
Subjective:  Margaret Ramirez is a 51 y.o. female who presents for evaluation of cough and feeling bad for 4+ days.  Symptoms include headache, cough, fever up to 101.8, some sinus pressure, ear pain, sore throat,drainage down back of throat.  Lost voice today.  Cough is productive with green/brown mucous.  Patient is a non-smoker.  Using Mucinex multi symptom for symptoms.  Using Tylenol for fever.  Denies sick contacts.  No other aggravating or relieving factors.  Been a long time since last illness.  Gets flu shot yearly.  No other c/o.  Past Medical History  Diagnosis Date  . Adjustment disorder with anxiety   . Sinus bradycardia   . Anemia    ROS as noted in HPI   Objective:   Filed Vitals:   08/25/11 1458  BP: 122/80  Pulse: 82  Temp: 98.4 F (36.9 C)  Resp: 16    General appearance: Alert, WD/WN, no distress, ill appearing, hoarse voice, deep wet cough                             Skin: warm, no rash                           Head: +frontal sinus tenderness,                            Eyes: conjunctiva normal, corneas clear, PERRLA                            Ears: right ear canal with cerumen blocking view of TM, left TM pearly, external ear canals normal                          Nose: septum midline, turbinates swollen, with erythema and clear discharge             Mouth/throat: MMM, tongue normal, mild pharyngeal erythema                           Neck: supple, no adenopathy, no thyromegaly, nontender                          Heart: RRR, normal S1, S2, no murmurs                         Lungs: few rhonchi, no wheezes, rales       Assessment and Plan:   Encounter Diagnosis  Name Primary?  . Sinobronchitis Yes   Prescription given for Azithromycin and Hycodan QHS use only. Rest, hydrate well, can use OTC Benadryl for congestion, voice rest and warm fluids for hoarseness and sore throat.  Tylenol or Ibuprofen OTC for fever and malaise.  Discussed symptomatic relief,  nasal saline, and call or return if worse or not improving in 2-3 days.

## 2011-11-25 ENCOUNTER — Ambulatory Visit: Payer: Self-pay | Admitting: Obstetrics and Gynecology

## 2011-12-06 ENCOUNTER — Ambulatory Visit (INDEPENDENT_AMBULATORY_CARE_PROVIDER_SITE_OTHER): Payer: 59 | Admitting: Emergency Medicine

## 2011-12-06 VITALS — BP 110/79 | HR 80 | Temp 98.1°F | Resp 18

## 2011-12-06 DIAGNOSIS — Z23 Encounter for immunization: Secondary | ICD-10-CM

## 2011-12-06 DIAGNOSIS — S51809A Unspecified open wound of unspecified forearm, initial encounter: Secondary | ICD-10-CM

## 2011-12-06 DIAGNOSIS — S51819A Laceration without foreign body of unspecified forearm, initial encounter: Secondary | ICD-10-CM

## 2011-12-06 DIAGNOSIS — A280 Pasteurellosis: Secondary | ICD-10-CM

## 2011-12-06 MED ORDER — AMOXICILLIN-POT CLAVULANATE 875-125 MG PO TABS: 1.0000 | ORAL_TABLET | Freq: Two times a day (BID) | ORAL | Status: AC

## 2011-12-06 NOTE — Progress Notes (Signed)
  Subjective:    Patient ID: Margaret Ramirez, female    DOB: [birth year], 51 y.o.   MRN: [id number]  Animal Bite  The incident occurred just prior to arrival. The incident occurred at home. There is an injury to the right forearm and left forearm. Pertinent negatives include no chest pain, no fussiness, no numbness, no visual disturbance, no bladder incontinence, no headaches, no hearing loss, no inability to bear weight, no neck pain, no pain when bearing weight, no focal weakness, no decreased responsiveness, no light-headedness, no loss of consciousness, no seizures, no tingling, no weakness, no cough, no difficulty breathing and no memory loss. There have been no prior injuries to these areas. She is right-handed. Her tetanus status is out of date. She has been behaving normally. There were no sick contacts. She has received no recent medical care. Services received include medications given.      Review of Systems  Constitutional: Negative for decreased responsiveness.  HENT: Negative for hearing loss and neck pain.   Eyes: Negative for visual disturbance.  Respiratory: Negative for cough.   Cardiovascular: Negative for chest pain.  Genitourinary: Negative for bladder incontinence.  Neurological: Negative for tingling, focal weakness, seizures, loss of consciousness, weakness, light-headedness, numbness and headaches.  Psychiatric/Behavioral: Negative for memory loss.  All other systems reviewed and are negative.       Objective:   Physical Exam  Nursing note and vitals reviewed. Constitutional: She is oriented to person, place, and time. She appears well-developed and well-nourished.  HENT:  Head: Normocephalic and atraumatic.  Eyes: Conjunctivae and EOM are normal. Pupils are equal, round, and reactive to light.  Neck: Normal range of motion. Neck supple.  Cardiovascular: Normal rate and regular rhythm.   Pulmonary/Chest: Effort normal.  Abdominal: Soft.  Musculoskeletal:  Normal range of motion.  Neurological: She is alert and oriented to person, place, and time.  Skin: Skin is warm and dry. Bruising and laceration noted.          Assessment & Plan:  Plan to have patient clean and redress wounds for three days and return on Tuesday for delayed primary closure if no infection evidence.  NATI

## 2011-12-06 NOTE — Patient Instructions (Addendum)
Animal Bite  An animal bite can result in a scratch on the skin, deep open cut, puncture of the skin, crush injury, or tearing away of the skin or a body part. Dogs are responsible for most animal bites. Children are bitten more often than adults. An animal bite can range from very mild to more serious. A small bite from your house pet is no cause for alarm. However, some animal bites can become infected or injure a bone or other tissue. You must seek medical care if:  · The skin is broken and bleeding does not slow down or stop after 15 minutes.  · The puncture is deep and difficult to clean (such as a cat bite).  · Pain, warmth, redness, or pus develops around the wound.  · The bite is from a stray animal or rodent. There may be a risk of rabies infection.  · The bite is from a snake, raccoon, skunk, fox, coyote, or bat. There may be a risk of rabies infection.  · The person bitten has a chronic illness such as diabetes, liver disease, or cancer, or the person takes medicine that lowers the immune system.  · There is concern about the location and severity of the bite.  It is important to clean and protect an animal bite wound right away to prevent infection. Follow these steps:  · Clean the wound with plenty of water and soap.  · Apply an antibiotic cream.  · Apply gentle pressure over the wound with a clean towel or gauze to slow or stop bleeding.  · Elevate the affected area above the heart to help stop any bleeding.  · Seek medical care. Getting medical care within 8 hours of the animal bite leads to the best possible outcome.  DIAGNOSIS   Your caregiver will most likely:  · Take a detailed history of the animal and the bite injury.  · Perform a wound exam.  · Take your medical history.  Blood tests or X-rays may be performed. Sometimes, infected bite wounds are cultured and sent to a lab to identify the infectious bacteria.   TREATMENT   Medical treatment will depend on the location and type of animal bite as  well as the patient's medical history. Treatment may include:  · Wound care, such as cleaning and flushing the wound with saline solution, bandaging, and elevating the affected area.  · Antibiotics.  · Tetanus immunization.  · Rabies immunization.  · Leaving the wound open to heal. This is often done with animal bites, due to the high risk of infection. However, in certain cases, wound closure with stitches, wound adhesive, skin adhesive strips, or staples may be used.   Infected bites that are left untreated may require intravenous (IV) antibiotics and surgical treatment in the hospital.  HOME CARE INSTRUCTIONS  · Follow your caregiver's instructions for wound care.  · Take all medicines as directed.  · If your caregiver prescribes antibiotics, take them as directed. Finish them even if you start to feel better.  · Follow up with your caregiver for further exams or immunizations as directed.  You may need a tetanus shot if:  · You cannot remember when you had your last tetanus shot.  · You have never had a tetanus shot.  · The injury broke your skin.  If you get a tetanus shot, your arm may swell, get red, and feel warm to the touch. This is common and not a problem. If you need a tetanus   shot and you choose not to have one, there is a rare chance of getting tetanus. Sickness from tetanus can be serious.  SEEK MEDICAL CARE IF:  · You notice warmth, redness, soreness, swelling, pus discharge, or a bad smell coming from the wound.  · You have a red line on the skin coming from the wound.  · You have a fever, chills, or a general ill feeling.  · You have nausea or vomiting.  · You have continued or worsening pain.  · You have trouble moving the injured part.  · You have other questions or concerns.  MAKE SURE YOU:  · Understand these instructions.  · Will watch your condition.  · Will get help right away if you are not doing well or get worse.  Document Released: 02/18/2011 Document Revised: 05/22/2011 Document  Reviewed: 02/18/2011  [employer]® Patient Information ©2012 [employer].

## 2011-12-06 NOTE — Addendum Note (Signed)
Addended byCaffie Damme on: 12/06/2011 10:18 AM   Modules accepted: Orders

## 2011-12-09 ENCOUNTER — Ambulatory Visit (INDEPENDENT_AMBULATORY_CARE_PROVIDER_SITE_OTHER): Payer: 59 | Admitting: Physician Assistant

## 2011-12-09 VITALS — BP 106/69 | HR 58 | Temp 98.5°F | Resp 16 | Ht 69.5 in | Wt 215.0 lb

## 2011-12-09 DIAGNOSIS — T148XXA Other injury of unspecified body region, initial encounter: Secondary | ICD-10-CM

## 2011-12-09 DIAGNOSIS — W540XXA Bitten by dog, initial encounter: Secondary | ICD-10-CM

## 2011-12-09 MED ORDER — MUPIROCIN 2 % EX OINT: TOPICAL_OINTMENT | Freq: Two times a day (BID) | CUTANEOUS | Status: AC

## 2011-12-09 NOTE — Progress Notes (Signed)
  Subjective:    Patient ID: Margaret Ramirez, female    DOB: 21-Jan-1961, 51 y.o.   MRN: [id number]  HPI Patient here for recheck Dog bite from 4 days ago.  The dog was not UTD on its immunizations and has been quarantined and will be euthanized after the incubation period.  Patient denies any fevers.  She has been cleaning the wounds as directed and taking her antibiotics. No f/c.  Review of Systems  All other systems reviewed and are negative.       Objective:   Physical Exam  Nursing note and vitals reviewed. Constitutional: She appears well-developed and well-nourished.  Skin:       Mild erythema surrounding wounds but no induration. No streaking. No axillary lymphadenopathy.      Assessment & Plan:  Dog bites-healing.  Will redress with mupirocin 2% ointment and curlex covered by Ace wraps.  Rx sent so she can apply this BID.  Continue wound care and see me in 48 hrs, sooner if worsens.

## 2011-12-10 ENCOUNTER — Telehealth: Payer: Self-pay | Admitting: Internal Medicine

## 2011-12-10 MED ORDER — ALPRAZOLAM 0.5 MG PO TABS: 0.5000 mg | ORAL_TABLET | ORAL | Status: DC | PRN

## 2011-12-10 NOTE — Telephone Encounter (Signed)
Ok to refill #30, 0 refill 

## 2011-12-10 NOTE — Telephone Encounter (Signed)
Done

## 2012-02-02 ENCOUNTER — Ambulatory Visit (INDEPENDENT_AMBULATORY_CARE_PROVIDER_SITE_OTHER): Payer: 59 | Admitting: Medical

## 2012-02-02 ENCOUNTER — Encounter: Payer: Self-pay | Admitting: Medical

## 2012-02-02 VITALS — BP 108/70 | HR 78 | Temp 98.2°F | Resp 16 | Wt 239.0 lb

## 2012-02-02 DIAGNOSIS — H938X9 Other specified disorders of ear, unspecified ear: Secondary | ICD-10-CM

## 2012-02-02 DIAGNOSIS — H6121 Impacted cerumen, right ear: Secondary | ICD-10-CM

## 2012-02-02 DIAGNOSIS — H6120 Impacted cerumen, unspecified ear: Secondary | ICD-10-CM

## 2012-02-02 NOTE — Progress Notes (Signed)
Subjective: Here for right ear c/o.  Over the weekend felt muffled in right ear.  Bought OTC ear wax drops and used them early yesterday morning, but since then feels like she has fluid blocked up in her right ear.  Denies pain, fever, sinus pressure, sore throat, headache.  No NVD. No other c/o.  Objective: Gen: wd, wn, nad Ears: left TM and canal normal, right canal with impacted cerumen  Assessment: Encounter Diagnoses  Name Primary?  . Impacted cerumen of right ear Yes  . Ear fullness    Plan:  Used warm water for ear lavage and successfully removed cerumen from right ear.    Ear fullness - advised good hydration, begin Mucinex D OTC and if not feeling completely resolution by Friday let me know.

## 2012-02-11 ENCOUNTER — Ambulatory Visit: Payer: Self-pay | Admitting: Obstetrics and Gynecology

## 2012-02-13 ENCOUNTER — Encounter: Payer: Self-pay | Admitting: Obstetrics and Gynecology

## 2012-02-20 ENCOUNTER — Encounter: Payer: Self-pay | Admitting: Obstetrics and Gynecology

## 2012-03-26 ENCOUNTER — Other Ambulatory Visit: Payer: Self-pay | Admitting: Family Medicine

## 2012-03-26 NOTE — Telephone Encounter (Signed)
Okay for #30, no refills (last filled in June).  Needs to schedule med check or CPE for further refills (last CPE 11/2010)

## 2012-03-26 NOTE — Telephone Encounter (Signed)
Called xanax in per knapp  

## 2012-03-26 NOTE — Telephone Encounter (Signed)
Is this ok?

## 2012-06-11 ENCOUNTER — Other Ambulatory Visit: Payer: Self-pay | Admitting: Obstetrics and Gynecology

## 2012-06-11 DIAGNOSIS — Z1231 Encounter for screening mammogram for malignant neoplasm of breast: Secondary | ICD-10-CM

## 2012-06-14 ENCOUNTER — Ambulatory Visit (HOSPITAL_COMMUNITY)
Admission: RE | Admit: 2012-06-14 | Discharge: 2012-06-14 | Disposition: A | Discharge status: Home / Self Care | Payer: 59 | Source: Ambulatory Visit | Attending: Obstetrics and Gynecology | Admitting: Obstetrics and Gynecology

## 2012-06-14 DIAGNOSIS — Z1231 Encounter for screening mammogram for malignant neoplasm of breast: Secondary | ICD-10-CM

## 2012-07-26 ENCOUNTER — Ambulatory Visit: Payer: 59 | Admitting: Obstetrics and Gynecology

## 2012-07-26 ENCOUNTER — Encounter: Payer: Self-pay | Admitting: Obstetrics and Gynecology

## 2012-07-26 VITALS — BP 112/62 | Ht 69.0 in | Wt 233.0 lb

## 2012-07-26 DIAGNOSIS — N951 Menopausal and female climacteric states: Secondary | ICD-10-CM

## 2012-07-26 DIAGNOSIS — Z124 Encounter for screening for malignant neoplasm of cervix: Secondary | ICD-10-CM

## 2012-07-26 NOTE — Progress Notes (Signed)
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 female G2P2 who presents for annual exam.  The patient has no complaints today.   The following portions of the patient's history were reviewed and updated as appropriate: allergies, current medications, past family history, past medical history, past social history, past surgical history and problem list.  Review of Systems Pertinent items are noted in HPI. Gastrointestinal:No change in bowel habits, no abdominal pain, no rectal bleeding Genitourinary:negative for dysuria, frequency, hematuria, nocturia and urinary incontinence    Objective:     BP 112/62  Ht 5\' 9"  (1.753 m)  Wt 233 lb (105.688 kg)  BMI 34.39 kg/m2  LMP 11/19/2008  Weight:  Wt Readings from Last 1 Encounters:  07/26/12 233 lb (105.688 kg)     BMI: Body mass index is 34.39 kg/(m^2). General Appearance: Alert, appropriate appearance for age. No acute distress HEENT: Grossly normal Neck / Thyroid: Supple, no masses, nodes or enlargement Lungs: clear to auscultation bilaterally Back: No CVA tenderness Breast Exam: No masses or nodes.No dimpling, nipple retraction or discharge. Cardiovascular: Regular rate and rhythm. S1, S2, no murmur Gastrointestinal: Soft, non-tender, no masses or organomegaly Pelvic Exam: Vulva and vagina appear normal. Bimanual exam reveals normal uterus and adnexa. Rectovaginal: normal rectal, no masses Lymphatic Exam: Non-palpable nodes in neck,  clavicular, axillary, or inguinal regions Skin: no rash or abnormalities Neurologic: Normal gait and speech, no tremor  Psychiatric: Alert and oriented, appropriate affect.    Urinalysis:Not done    Assessment:    Normal gyn exam Menopause without sx.   Plan:   mammogram pap smear with HR HPV return annually or prn

## 2012-07-27 LAB — PAP IG AND HPV HIGH-RISK: HPV DNA High Risk: NOT DETECTED

## 2012-10-05 ENCOUNTER — Ambulatory Visit: Payer: 59 | Admitting: Family Medicine

## 2013-05-10 ENCOUNTER — Ambulatory Visit (INDEPENDENT_AMBULATORY_CARE_PROVIDER_SITE_OTHER): Payer: 59 | Admitting: Medical

## 2013-05-10 ENCOUNTER — Encounter: Payer: Self-pay | Admitting: Medical

## 2013-05-10 VITALS — BP 102/70 | HR 62 | Temp 98.0°F | Resp 16 | Wt 235.0 lb

## 2013-05-10 DIAGNOSIS — J3489 Other specified disorders of nose and nasal sinuses: Secondary | ICD-10-CM

## 2013-05-10 DIAGNOSIS — J019 Acute sinusitis, unspecified: Secondary | ICD-10-CM

## 2013-05-10 DIAGNOSIS — R0981 Nasal congestion: Secondary | ICD-10-CM

## 2013-05-10 MED ORDER — AMOXICILLIN 875 MG PO TABS: 875.0000 mg | ORAL_TABLET | Freq: Two times a day (BID) | ORAL | Status: DC

## 2013-05-10 MED ORDER — FLUTICASONE PROPIONATE 50 MCG/ACT NA SUSP: 2.0000 | Freq: Every day | NASAL | Status: DC

## 2013-05-10 NOTE — Progress Notes (Signed)
Subjective:  Margaret Ramirez is a 52 y.o. female who presents for possible sinus infection.  Symptoms include 6 day hx/o sinus pressure, headache, pressure behind eyes, ear pressure, cough, post nasal drip, green nasal discharge, and coughing up green sputum.  Has chest congestion too.  Denies fever, NVD, abdominal pain.  Past history is significant for sinusitis and chronic nasal congestion year round. Using Tylenol Cold and Sinus, Neti Pot for symptoms. + sick contacts.  No other aggravating or relieving factors.  No other c/o.  ROS as in subjective   Objective: Filed Vitals:   05/10/13 1542  BP: 102/70  Pulse: 62  Temp: 98 F (36.7 C)  Resp: 16    General appearance: Alert, WD/WN, no distress                             Skin: warm, no rash                           Head: + frontal and maxillary sinus tenderness                            Eyes: conjunctiva normal, corneas clear, PERRLA                            Ears: pearly TMs, external ear canals normal                          Nose: septum midline, turbinates swollen, with erythema and clear discharge             Mouth/throat: MMM, tongue normal, mild pharyngeal erythema                           Neck: supple, no adenopathy, no thyromegaly, nontender                          Heart: RRR, normal S1, S2, no murmurs                         Lungs: CTA bilaterally, no wheezes, rales, or rhonchi      Assessment and Plan: Encounter Diagnoses  Name Primary?  . Sinusitis, acute Yes  . Chronic nasal congestion    Sinusitis - Prescription given for Amoxicillin.  Begin flonase for chronic nasal congestion.  Tylenol or Ibuprofen OTC for fever and malaise.  Discussed symptomatic relief, nasal saline flush, and call or return if worse or not improving.

## 2013-05-16 ENCOUNTER — Other Ambulatory Visit: Payer: Self-pay | Admitting: Obstetrics and Gynecology

## 2013-05-16 DIAGNOSIS — Z1231 Encounter for screening mammogram for malignant neoplasm of breast: Secondary | ICD-10-CM

## 2013-06-20 ENCOUNTER — Ambulatory Visit (HOSPITAL_COMMUNITY)
Admission: RE | Admit: 2013-06-20 | Discharge: 2013-06-20 | Disposition: A | Discharge status: Home / Self Care | Payer: 59 | Source: Ambulatory Visit | Attending: Obstetrics and Gynecology | Admitting: Obstetrics and Gynecology

## 2013-06-20 DIAGNOSIS — Z1231 Encounter for screening mammogram for malignant neoplasm of breast: Secondary | ICD-10-CM

## 2013-09-14 ENCOUNTER — Ambulatory Visit: Payer: 59 | Admitting: Family Medicine

## 2013-11-24 ENCOUNTER — Encounter: Payer: 59 | Admitting: Family Medicine

## 2013-12-14 ENCOUNTER — Other Ambulatory Visit: Payer: Self-pay | Admitting: *Deleted

## 2013-12-14 DIAGNOSIS — I83893 Varicose veins of bilateral lower extremities with other complications: Secondary | ICD-10-CM

## 2013-12-27 ENCOUNTER — Encounter: Payer: Self-pay | Admitting: Vascular Surgery

## 2013-12-28 ENCOUNTER — Encounter (HOSPITAL_COMMUNITY): Payer: 59

## 2013-12-28 ENCOUNTER — Encounter: Payer: 59 | Admitting: Vascular Surgery

## 2014-01-26 ENCOUNTER — Other Ambulatory Visit: Payer: Self-pay | Admitting: Orthopedic Surgery

## 2014-03-07 ENCOUNTER — Encounter (HOSPITAL_BASED_OUTPATIENT_CLINIC_OR_DEPARTMENT_OTHER): Payer: Self-pay | Admitting: *Deleted

## 2014-03-08 ENCOUNTER — Encounter (HOSPITAL_BASED_OUTPATIENT_CLINIC_OR_DEPARTMENT_OTHER): Payer: Self-pay | Admitting: *Deleted

## 2014-03-08 NOTE — Progress Notes (Signed)
NPO AFTER MN  WITH EXCEPTION CLEAR LIQUIDS UNTIL 0730 (NO CREAM/ MILK PRODUCTS).  ARRIVE AT 1130.  NEEDS HG. 

## 2014-03-13 NOTE — Anesthesia Preprocedure Evaluation (Addendum)
Anesthesia Evaluation  Patient identified by MRN, date of birth, ID band Patient awake    Reviewed: Allergy & Precautions, H&P , NPO status , Patient's Chart, lab work & pertinent test results  History of Anesthesia Complications Negative for: history of anesthetic complications  Airway Mallampati: II TM Distance: >3 FB Neck ROM: Full    Dental no notable dental hx.    Pulmonary neg pulmonary ROS,  breath sounds clear to auscultation  Pulmonary exam normal       Cardiovascular Exercise Tolerance: Good negative cardio ROS  Rhythm:Regular Rate:Normal     Neuro/Psych negative neurological ROS  negative psych ROS   GI/Hepatic Neg liver ROS, GERD-  Medicated and Controlled,  Endo/Other  negative endocrine ROS  Renal/GU negative Renal ROS  negative genitourinary   Musculoskeletal  (+) Arthritis -, Osteoarthritis,    Abdominal   Peds negative pediatric ROS (+)  Hematology  (+) anemia ,   Anesthesia Other Findings   Reproductive/Obstetrics negative OB ROS                          Anesthesia Physical Anesthesia Plan  ASA: II  Anesthesia Plan: General   Post-op Pain Management:    Induction: Intravenous  Airway Management Planned: LMA  Additional Equipment:   Intra-op Plan:   Post-operative Plan: Extubation in OR  Informed Consent: I have reviewed the patients History and Physical, chart, labs and discussed the procedure including the risks, benefits and alternatives for the proposed anesthesia with the patient or authorized representative who has indicated his/her understanding and acceptance.   Dental advisory given  Plan Discussed with: CRNA  Anesthesia Plan Comments:         Anesthesia Quick Evaluation

## 2014-03-14 ENCOUNTER — Encounter (HOSPITAL_BASED_OUTPATIENT_CLINIC_OR_DEPARTMENT_OTHER)
Admission: RE | Disposition: A | Discharge status: Home / Self Care | Payer: Self-pay | Source: Ambulatory Visit | Attending: Specialist

## 2014-03-14 ENCOUNTER — Ambulatory Visit (HOSPITAL_BASED_OUTPATIENT_CLINIC_OR_DEPARTMENT_OTHER)
Admission: RE | Admit: 2014-03-14 | Discharge: 2014-03-14 | Disposition: A | Discharge status: Home / Self Care | Payer: 59 | Source: Ambulatory Visit | Attending: Specialist | Admitting: Specialist

## 2014-03-14 ENCOUNTER — Ambulatory Visit (HOSPITAL_BASED_OUTPATIENT_CLINIC_OR_DEPARTMENT_OTHER): Payer: 59 | Admitting: Anesthesiology

## 2014-03-14 ENCOUNTER — Encounter (HOSPITAL_BASED_OUTPATIENT_CLINIC_OR_DEPARTMENT_OTHER): Payer: Self-pay | Admitting: *Deleted

## 2014-03-14 ENCOUNTER — Encounter (HOSPITAL_BASED_OUTPATIENT_CLINIC_OR_DEPARTMENT_OTHER): Payer: 59 | Admitting: Anesthesiology

## 2014-03-14 DIAGNOSIS — IMO0002 Reserved for concepts with insufficient information to code with codable children: Secondary | ICD-10-CM | POA: Insufficient documentation

## 2014-03-14 DIAGNOSIS — X58XXXA Exposure to other specified factors, initial encounter: Secondary | ICD-10-CM | POA: Insufficient documentation

## 2014-03-14 DIAGNOSIS — M1710 Unilateral primary osteoarthritis, unspecified knee: Secondary | ICD-10-CM | POA: Diagnosis not present

## 2014-03-14 DIAGNOSIS — Z9889 Other specified postprocedural states: Secondary | ICD-10-CM

## 2014-03-14 DIAGNOSIS — Z888 Allergy status to other drugs, medicaments and biological substances status: Secondary | ICD-10-CM | POA: Insufficient documentation

## 2014-03-14 DIAGNOSIS — M9420 Chondromalacia, unspecified site: Secondary | ICD-10-CM | POA: Insufficient documentation

## 2014-03-14 HISTORY — DX: Unspecified tear of unspecified meniscus, current injury, left knee, initial encounter: S83.207A

## 2014-03-14 HISTORY — PX: CHONDROPLASTY: SHX5177

## 2014-03-14 HISTORY — DX: Unspecified osteoarthritis, unspecified site: M19.90

## 2014-03-14 HISTORY — DX: Presence of spectacles and contact lenses: Z97.3

## 2014-03-14 HISTORY — PX: KNEE ARTHROSCOPY WITH MEDIAL MENISECTOMY: SHX5651

## 2014-03-14 HISTORY — PX: KNEE ARTHROSCOPY: SHX127

## 2014-03-14 LAB — POCT HEMOGLOBIN-HEMACUE: HEMOGLOBIN: 10.2 g/dL — AB (ref 12.0–15.0)

## 2014-03-14 SURGERY — ARTHROSCOPY, KNEE, WITH MEDIAL MENISCECTOMY
Anesthesia: General | Site: Knee | Laterality: Left

## 2014-03-14 MED ORDER — POVIDONE-IODINE 7.5 % EX SOLN
Freq: Once | CUTANEOUS | Status: DC
  Filled 2014-03-14: qty 118

## 2014-03-14 MED ORDER — MIDAZOLAM HCL 2 MG/2ML IJ SOLN
INTRAMUSCULAR | Status: AC
  Filled 2014-03-14: qty 2

## 2014-03-14 MED ORDER — FENTANYL CITRATE 0.05 MG/ML IJ SOLN
25.0000 ug | INTRAMUSCULAR | Status: DC | PRN
  Filled 2014-03-14: qty 1

## 2014-03-14 MED ORDER — HYDROCODONE-ACETAMINOPHEN 5-325 MG PO TABS: 1.0000 | ORAL_TABLET | Freq: Four times a day (QID) | ORAL | Status: DC | PRN

## 2014-03-14 MED ORDER — VANCOMYCIN HCL 10 G IV SOLR
1500.0000 mg | Freq: Once | INTRAVENOUS | Status: AC
  Administered 2014-03-14: 1500 mg via INTRAVENOUS
  Filled 2014-03-14: qty 1500

## 2014-03-14 MED ORDER — LIDOCAINE HCL (CARDIAC) 20 MG/ML IV SOLN
INTRAVENOUS | Status: DC | PRN
  Administered 2014-03-14: 60 mg via INTRAVENOUS

## 2014-03-14 MED ORDER — PROMETHAZINE HCL 25 MG/ML IJ SOLN
6.2500 mg | INTRAMUSCULAR | Status: DC | PRN
  Filled 2014-03-14: qty 1

## 2014-03-14 MED ORDER — ASPIRIN EC 325 MG PO TBEC: 325.0000 mg | DELAYED_RELEASE_TABLET | Freq: Two times a day (BID) | ORAL | Status: DC

## 2014-03-14 MED ORDER — LACTATED RINGERS IV SOLN
INTRAVENOUS | Status: DC
  Administered 2014-03-14: 12:00:00 via INTRAVENOUS
  Filled 2014-03-14: qty 1000

## 2014-03-14 MED ORDER — SULFAMETHOXAZOLE-TRIMETHOPRIM 800-160 MG PO TABS: 1.0000 | ORAL_TABLET | Freq: Two times a day (BID) | ORAL | Status: DC

## 2014-03-14 MED ORDER — MORPHINE SULFATE 4 MG/ML IJ SOLN
INTRAMUSCULAR | Status: DC | PRN
  Administered 2014-03-14: 4 mg via INTRAVENOUS

## 2014-03-14 MED ORDER — FENTANYL CITRATE 0.05 MG/ML IJ SOLN
INTRAMUSCULAR | Status: DC | PRN
  Administered 2014-03-14: 25 ug via INTRAVENOUS
  Administered 2014-03-14: 50 ug via INTRAVENOUS
  Administered 2014-03-14: 25 ug via INTRAVENOUS
  Administered 2014-03-14 (×2): 50 ug via INTRAVENOUS

## 2014-03-14 MED ORDER — SODIUM CHLORIDE 0.9 % IR SOLN
Status: DC | PRN
  Administered 2014-03-14: 6000 mL

## 2014-03-14 MED ORDER — PROPOFOL 10 MG/ML IV BOLUS
INTRAVENOUS | Status: DC | PRN
  Administered 2014-03-14: 170 mg via INTRAVENOUS

## 2014-03-14 MED ORDER — MORPHINE SULFATE 4 MG/ML IJ SOLN
INTRAMUSCULAR | Status: AC
Start: 2014-03-14 — End: 2014-03-14
  Filled 2014-03-14: qty 1

## 2014-03-14 MED ORDER — DEXAMETHASONE SODIUM PHOSPHATE 4 MG/ML IJ SOLN
INTRAMUSCULAR | Status: DC | PRN
  Administered 2014-03-14: 8 mg via INTRAVENOUS

## 2014-03-14 MED ORDER — SODIUM CHLORIDE 0.9 % IV SOLN
1500.0000 mg | INTRAVENOUS | Status: DC
  Filled 2014-03-14: qty 1500

## 2014-03-14 MED ORDER — MIDAZOLAM HCL 5 MG/5ML IJ SOLN
INTRAMUSCULAR | Status: DC | PRN
  Administered 2014-03-14: 2 mg via INTRAVENOUS

## 2014-03-14 MED ORDER — ONDANSETRON HCL 4 MG/2ML IJ SOLN
INTRAMUSCULAR | Status: DC | PRN
  Administered 2014-03-14: 4 mg via INTRAVENOUS

## 2014-03-14 MED ORDER — FENTANYL CITRATE 0.05 MG/ML IJ SOLN
INTRAMUSCULAR | Status: AC
  Filled 2014-03-14: qty 6

## 2014-03-14 MED ORDER — BUPIVACAINE HCL 0.25 % IJ SOLN
INTRAMUSCULAR | Status: DC | PRN
  Administered 2014-03-14: 30 mL

## 2014-03-14 SURGICAL SUPPLY — 60 items
BANDAGE ESMARK 6X9 LF (GAUZE/BANDAGES/DRESSINGS) IMPLANT
BLADE 4.2CUDA (BLADE) IMPLANT
BLADE CUDA 4.2 (BLADE) IMPLANT
BLADE CUDA GRT WHITE 3.5 (BLADE) ×3 IMPLANT
BLADE CUDA SHAVER 3.5 (BLADE) IMPLANT
BNDG CMPR 9X6 STRL LF SNTH (GAUZE/BANDAGES/DRESSINGS)
BNDG ESMARK 6X9 LF (GAUZE/BANDAGES/DRESSINGS)
BNDG GAUZE ELAST 4 BULKY (GAUZE/BANDAGES/DRESSINGS) ×4 IMPLANT
CANISTER SUCT LVC 12 LTR MEDI- (MISCELLANEOUS) ×9 IMPLANT
CANISTER SUCTION 1200CC (MISCELLANEOUS) ×3 IMPLANT
CLOTH BEACON ORANGE TIMEOUT ST (SAFETY) ×3 IMPLANT
CUFF TOURN SGL QUICK 34 (TOURNIQUET CUFF) ×3
CUFF TRNQT CYL 34X4X40X1 (TOURNIQUET CUFF) IMPLANT
DRAPE ARTHROSCOPY W/POUCH 114 (DRAPES) ×3 IMPLANT
DRAPE INCISE 23X17 IOBAN STRL (DRAPES) ×2
DRAPE INCISE 23X17 STRL (DRAPES) ×1 IMPLANT
DRAPE INCISE IOBAN 23X17 STRL (DRAPES) ×1 IMPLANT
DRAPE INCISE IOBAN 66X45 STRL (DRAPES) ×3 IMPLANT
DRSG EMULSION OIL 3X3 NADH (GAUZE/BANDAGES/DRESSINGS) ×2 IMPLANT
DRSG PAD ABDOMINAL 8X10 ST (GAUZE/BANDAGES/DRESSINGS) ×2 IMPLANT
DURAPREP 26ML APPLICATOR (WOUND CARE) ×3 IMPLANT
ELECT MENISCUS 165MM 90D (ELECTRODE) IMPLANT
ELECT REM PT RETURN 9FT ADLT (ELECTROSURGICAL)
ELECTRODE REM PT RTRN 9FT ADLT (ELECTROSURGICAL) IMPLANT
GAUZE XEROFORM 1X8 LF (GAUZE/BANDAGES/DRESSINGS) ×3 IMPLANT
GLOVE BIO SURGEON STRL SZ7.5 (GLOVE) ×3 IMPLANT
GLOVE BIOGEL M 6.5 STRL (GLOVE) ×4 IMPLANT
GLOVE BIOGEL PI IND STRL 6.5 (GLOVE) IMPLANT
GLOVE BIOGEL PI IND STRL 7.5 (GLOVE) IMPLANT
GLOVE BIOGEL PI INDICATOR 6.5 (GLOVE) ×4
GLOVE BIOGEL PI INDICATOR 7.5 (GLOVE) ×2
GLOVE INDICATOR 8.0 STRL GRN (GLOVE) ×6 IMPLANT
GLOVE SURG ORTHO 8.0 STRL STRW (GLOVE) ×6 IMPLANT
GOWN PREVENTION PLUS LG XLONG (DISPOSABLE) ×1 IMPLANT
GOWN STRL REIN XL XLG (GOWN DISPOSABLE) ×1 IMPLANT
GOWN STRL REUS W/TWL LRG LVL3 (GOWN DISPOSABLE) ×4 IMPLANT
GOWN STRL REUS W/TWL XL LVL3 (GOWN DISPOSABLE) ×4 IMPLANT
IMMOBILIZER KNEE 22 UNIV (SOFTGOODS) IMPLANT
IMMOBILIZER KNEE 24 THIGH 36 (MISCELLANEOUS) IMPLANT
IMMOBILIZER KNEE 24 UNIV (MISCELLANEOUS)
KNEE WRAP E Z 3 GEL PACK (MISCELLANEOUS) ×3 IMPLANT
MINI VAC (SURGICAL WAND) ×3 IMPLANT
PACK ARTHROSCOPY DSU (CUSTOM PROCEDURE TRAY) ×3 IMPLANT
PACK BASIN DAY SURGERY FS (CUSTOM PROCEDURE TRAY) ×3 IMPLANT
PAD ABD 8X10 STRL (GAUZE/BANDAGES/DRESSINGS) ×6 IMPLANT
PADDING CAST ABS 4INX4YD NS (CAST SUPPLIES) ×2
PADDING CAST ABS COTTON 4X4 ST (CAST SUPPLIES) ×1 IMPLANT
PENCIL BUTTON HOLSTER BLD 10FT (ELECTRODE) IMPLANT
SET ARTHROSCOPY TUBING (MISCELLANEOUS) ×3
SET ARTHROSCOPY TUBING LN (MISCELLANEOUS) ×1 IMPLANT
SPONGE GAUZE 4X4 12PLY (GAUZE/BANDAGES/DRESSINGS) ×3 IMPLANT
SPONGE GAUZE 4X4 12PLY STER LF (GAUZE/BANDAGES/DRESSINGS) ×2 IMPLANT
SUT ETHILON 4 0 PS 2 18 (SUTURE) ×3 IMPLANT
SYR CONTROL 10ML LL (SYRINGE) ×3 IMPLANT
TOWEL OR 17X24 6PK STRL BLUE (TOWEL DISPOSABLE) ×3 IMPLANT
TUBE CONNECTING 12'X1/4 (SUCTIONS) ×1
TUBE CONNECTING 12X1/4 (SUCTIONS) ×2 IMPLANT
WAND 30 DEG SABER W/CORD (SURGICAL WAND) IMPLANT
WAND 90 DEG TURBOVAC W/CORD (SURGICAL WAND) IMPLANT
WATER STERILE IRR 500ML POUR (IV SOLUTION) ×3 IMPLANT

## 2014-03-14 NOTE — H&P (Signed)
Margaret MuldersWatseka G Christenson is an 53 y.o. female.   Chief Complaint: Left knee pain HPI: Patient presents with joint discomfort that had been persistent for several months now. Despite conservative treatments, her discomfort has not improved. Imaging was obtained. Other conservative and surgical treatments were discussed in detail. Patient wishes to proceed with surgery as consented. Denies SOB, CP, or calf pain. No Fever, chills, or nausea/ vomiting.   Past Medical History  Diagnosis Date  . Acute meniscal tear of left knee   . Wears contact lenses   . Arthritis     Past Surgical History  Procedure Laterality Date  . Cesarean section  1992  . Laparscopic right ovarian cystectomy  11-21-1999  . Colonoscopy with esophagogastroduodenoscopy (egd)  01-31-2011    Family History  Problem Relation Age of Onset  . Cancer Mother     lung  . Asthma Mother   . COPD Father     Emphysema  . Hyperlipidemia Sister   . Hyperlipidemia Brother   . Hypertension Brother   . Diabetes Neg Hx   . COPD Paternal Grandfather     Black lung   Social History:  reports that she has never smoked. She has never used smokeless tobacco. She reports that she does not drink alcohol or use illicit drugs.  Allergies:  Allergies  Allergen Reactions  . Cefprozil Rash    Medications Prior to Admission  Medication Sig Dispense Refill  . Multiple Vitamin (MULTIVITAMIN) tablet Take 1 tablet by mouth daily.          Results for orders placed during the hospital encounter of 03/14/14 (from the past 48 hour(s))  POCT HEMOGLOBIN-HEMACUE     Status: Abnormal   Collection Time    03/14/14 12:17 PM      Result Value Ref Range   Hemoglobin 10.2 (*) 12.0 - 15.0 g/dL   No results found.  Review of Systems  Constitutional: Negative.   HENT: Negative.   Eyes: Negative.   Respiratory: Negative.   Cardiovascular: Negative.   Gastrointestinal: Positive for heartburn.  Genitourinary: Negative.   Musculoskeletal: Positive  for joint pain.  Skin: Negative.   Neurological: Negative.   Endo/Heme/Allergies: Negative.   Psychiatric/Behavioral: Negative.     Blood pressure 122/60, pulse 61, temperature 98.4 F (36.9 C), temperature source Oral, resp. rate 16, height 5\' 9"  (1.753 m), weight 101.152 kg (223 lb), last menstrual period 11/19/2008, SpO2 98.00%. Physical Exam  Constitutional: She is oriented to person, place, and time. She appears well-developed.  HENT:  Head: Normocephalic.  Eyes: EOM are normal.  Neck: Normal range of motion.  Cardiovascular: Normal rate, normal heart sounds and intact distal pulses.   Respiratory: Effort normal and breath sounds normal.  GI: Soft.  Genitourinary:  Deferred  Musculoskeletal: She exhibits edema and tenderness.  Left knee  Neurological: She is alert and oriented to person, place, and time.  Skin: Skin is warm and dry.  Psychiatric: Her behavior is normal.     Assessment/Plan Left knee OA and TM: Left knee arthroscopy today as consented D/c home today Take medications as directed F/U in office in 7 days Follow instructions   Gwendolen Hewlett L 03/14/2014, 2:22 PM

## 2014-03-14 NOTE — H&P (View-Only) (Signed)
NPO AFTER MN  WITH EXCEPTION CLEAR LIQUIDS UNTIL 0730 (NO CREAM/ MILK PRODUCTS).  ARRIVE AT 1130.  NEEDS HG. 

## 2014-03-14 NOTE — Interval H&P Note (Signed)
History and Physical Interval Note:  03/14/2014 2:45 PM  Margaret Ramirez  has presented today for surgery, with the diagnosis of LEFT KNEE TORN MENISCUS AND CONDROMALASIA  The various methods of treatment have been discussed with the patient and family. After consideration of risks, benefits and other options for treatment, the patient has consented to  Procedure(s): KNEE ARTHROSCOPY WITH MEDIAL MENISECTOMY (Left) CHONDROPLASTY (Left) ARTHROSCOPY KNEE (Left) as a surgical intervention .  The patient's history has been reviewed, patient examined, no change in status, stable for surgery.  I have reviewed the patient's chart and labs.  Questions were answered to the patient's satisfaction.     Atley Neubert ANDREW

## 2014-03-14 NOTE — Discharge Instructions (Signed)

## 2014-03-14 NOTE — Anesthesia Procedure Notes (Signed)
Procedure Name: LMA Insertion Date/Time: 03/14/2014 2:50 PM Performed by: Renella CunasHAZEL, Pria Klosinski D Pre-anesthesia Checklist: Patient identified, Emergency Drugs available, Suction available and Patient being monitored Patient Re-evaluated:Patient Re-evaluated prior to inductionOxygen Delivery Method: Circle System Utilized Preoxygenation: Pre-oxygenation with 100% oxygen Intubation Type: IV induction Ventilation: Mask ventilation without difficulty LMA: LMA inserted LMA Size: 4.0 Number of attempts: 1 Airway Equipment and Method: bite block Placement Confirmation: positive ETCO2 Tube secured with: Tape Dental Injury: Teeth and Oropharynx as per pre-operative assessment

## 2014-03-14 NOTE — Op Note (Signed)
[id number]#[id number]#[id number]

## 2014-03-14 NOTE — Interval H&P Note (Signed)
History and Physical Interval Note:  03/14/2014 2:46 PM  Margaret Ramirez  has presented today for surgery, with the diagnosis of LEFT KNEE TORN MENISCUS AND CONDROMALASIA  The various methods of treatment have been discussed with the patient and family. After consideration of risks, benefits and other options for treatment, the patient has consented to  Procedure(s): KNEE ARTHROSCOPY WITH MEDIAL MENISECTOMY (Left) CHONDROPLASTY (Left) ARTHROSCOPY KNEE (Left) as a surgical intervention .  The patient's history has been reviewed, patient examined, no change in status, stable for surgery.  I have reviewed the patient's chart and labs.  Questions were answered to the patient's satisfaction.     Taysen Bushart ANDREW

## 2014-03-14 NOTE — Transfer of Care (Signed)
Immediate Anesthesia Transfer of Care Note  Patient: Margaret Ramirez  Procedure(s) Performed: Procedure(s) (LRB): KNEE ARTHROSCOPY WITH MEDIAL MENISECTOMY (Left) CHONDROPLASTY (Left) ARTHROSCOPY KNEE (Left)  Patient Location: PACU  Anesthesia Type: General  Level of Consciousness: awake, oriented, sedated and patient cooperative  Airway & Oxygen Therapy: Patient Spontanous Breathing and Patient connected to face mask oxygen  Post-op Assessment: Report given to PACU RN and Post -op Vital signs reviewed and stable  Post vital signs: Reviewed and stable  Complications: No apparent anesthesia complications

## 2014-03-15 ENCOUNTER — Encounter (HOSPITAL_BASED_OUTPATIENT_CLINIC_OR_DEPARTMENT_OTHER): Payer: Self-pay | Admitting: Specialist

## 2014-03-15 NOTE — Anesthesia Postprocedure Evaluation (Signed)
  Anesthesia Post-op Note  Patient: Margaret Ramirez  Procedure(s) Performed: Procedure(s) (LRB): KNEE ARTHROSCOPY WITH MEDIAL MENISECTOMY (Left) CHONDROPLASTY (Left) ARTHROSCOPY KNEE (Left)  Patient Location: PACU  Anesthesia Type: General  Level of Consciousness: awake and alert   Airway and Oxygen Therapy: Patient Spontanous Breathing  Post-op Pain: mild  Post-op Assessment: Post-op Vital signs reviewed, Patient's Cardiovascular Status Stable, Respiratory Function Stable, Patent Airway and No signs of Nausea or vomiting  Last Vitals:  Filed Vitals:   03/14/14 1633  BP: 111/68  Pulse: 54  Temp: 36.6 C  Resp: 18    Post-op Vital Signs: stable   Complications: No apparent anesthesia complications

## 2014-03-15 NOTE — Op Note (Signed)
NAMDollene Ramirez:  Ramirez, Margaret              ACCOUNT NO.:  [id number]  MEDICAL RECORD NO.:  [id number]  LOCATION:                                 FACILITY:  PHYSICIAN:  Erasmo Leventhalobert Andrew Casper Pagliuca, M.D.DATE OF BIRTH:  [birth year]  DATE OF PROCEDURE:  03/14/2014 DATE OF DISCHARGE:  03/14/2014                              OPERATIVE REPORT   PREOPERATIVE DIAGNOSIS:  Left knee torn medial meniscus, osteoarthritis.  POSTOPERATIVE DIAGNOSIS: 1. Left knee complex tear, medial meniscus, posterior horn. 2. Osteoarthritis grade 3-4 medial compartment, grade 3 patella, grade     4 femoral trochlea, grade 2 lateral compartment.  PROCEDURES: 1. Left knee arthroscopic partial medial meniscectomy. 2. Chondroplasty of medial femoral condyle, medial tibial plateau,     patella, and femoral trochlea, and lateral tibial plateau.  SURGEON:  Erasmo Leventhalobert Andrew Linkyn Gobin, M.D.  ASSISTANT:  Arsenio LoaderBryson Stilwell, PA-C.  ANESTHESIA:  General with intraoperative knee block.  ESTIMATED BLOOD LOSS:  Minimal.  DRAINS:  None.  COMPLICATIONS:  None.  TOURNIQUET TIME:  23 minutes.  COMPLICATIONS:  None.  DISPOSITION:  PACU, stable.  OPERATIVE DETAILS:  The patient's family counseled in the holding area. Correct site was identified, marked, and signed appropriately.  TED hose applied on the involved leg.  In the operating room, IV antibiotics were given.  Placed supine position, general anesthesia.  Left thigh was placed in a thigh holder.  Prepped with DuraPrep and draped into a sterile fashion.  Time-out was done, confirmed on the left side.  It was exsanguinated with Esmarch, tourniquet was inflated to 250 mmHg. Arthroscopic portals were established proximal medial, inferomedial, and inferolateral.  Diagnostic arthroscopy revealed grade 3-4 chondroplasty of patellofemoral joint, __________ stable based on both sides.  ACL and PCL were intact.  __________ medial and lateral gutters unremarkable. Lateral side was inspected.   Lateral meniscus was intact.  Grade 2, lateral tibial plateau __________ chondroplasty was performed. __________ inspected.  There was grade 3-4 chondromalacia present. Chondroplasty provided a stable base.  There was also unstable __________ basket motorized shaver.  A partial meniscectomy performed back to stable base __________ contoured and gently __________ meniscus only.  The knee was irrigated __________ removed.  A 20 mL of 0.25% Sensorcaine was placed into the joint in the form of morphine sulfate and then 10 mL of 0.25% Sensorcaine was placed in skin edges.  Sterile dressing applied.  Sterile tourniquet deflated.  TED hose applied and ice pack.  No complications or problems.  She was then awakened and taken to operative PACU in stable condition.  She will be stabilized in PACU and discharged to home.          ______________________________ Erasmo Leventhalobert Andrew Stevin Bielinski, M.D.     RAC/MEDQ  D:  03/14/2014  T:  03/15/2014  Job:  [id number]

## 2014-04-17 ENCOUNTER — Encounter (HOSPITAL_BASED_OUTPATIENT_CLINIC_OR_DEPARTMENT_OTHER): Payer: Self-pay | Admitting: Specialist

## 2014-05-15 ENCOUNTER — Encounter: Payer: 59 | Admitting: Family Medicine

## 2014-05-29 ENCOUNTER — Encounter: Payer: Self-pay | Admitting: Medical

## 2014-06-15 ENCOUNTER — Encounter: Payer: 59 | Admitting: Family Medicine

## 2014-06-15 ENCOUNTER — Encounter: Payer: Self-pay | Admitting: Family Medicine

## 2014-06-15 ENCOUNTER — Ambulatory Visit (INDEPENDENT_AMBULATORY_CARE_PROVIDER_SITE_OTHER): Payer: 59 | Admitting: Family Medicine

## 2014-06-15 ENCOUNTER — Encounter: Payer: Self-pay | Admitting: Internal Medicine

## 2014-06-15 VITALS — BP 118/72 | HR 60 | Temp 97.2°F | Ht 68.0 in | Wt 233.0 lb

## 2014-06-15 DIAGNOSIS — E559 Vitamin D deficiency, unspecified: Secondary | ICD-10-CM

## 2014-06-15 DIAGNOSIS — E7800 Pure hypercholesterolemia, unspecified: Secondary | ICD-10-CM

## 2014-06-15 DIAGNOSIS — D509 Iron deficiency anemia, unspecified: Secondary | ICD-10-CM

## 2014-06-15 DIAGNOSIS — E669 Obesity, unspecified: Secondary | ICD-10-CM | POA: Insufficient documentation

## 2014-06-15 DIAGNOSIS — Z0000 Encounter for general adult medical examination without abnormal findings: Secondary | ICD-10-CM

## 2014-06-15 LAB — POCT URINALYSIS DIPSTICK
BILIRUBIN UA: NEGATIVE
GLUCOSE UA: NEGATIVE
KETONES UA: NEGATIVE
Leukocytes, UA: NEGATIVE
Nitrite, UA: NEGATIVE
Protein, UA: NEGATIVE
RBC UA: NEGATIVE
SPEC GRAV UA: 1.025
UROBILINOGEN UA: NEGATIVE
pH, UA: 6

## 2014-06-15 LAB — CBC WITH DIFFERENTIAL/PLATELET
BASOS ABS: 0.1 10*3/uL (ref 0.0–0.1)
BASOS PCT: 2 % — AB (ref 0–1)
EOS PCT: 6 % — AB (ref 0–5)
Eosinophils Absolute: 0.3 10*3/uL (ref 0.0–0.7)
HEMATOCRIT: 35 % — AB (ref 36.0–46.0)
Hemoglobin: 11.5 g/dL — ABNORMAL LOW (ref 12.0–15.0)
Lymphocytes Relative: 26 % (ref 12–46)
Lymphs Abs: 1.4 10*3/uL (ref 0.7–4.0)
MCH: 27.7 pg (ref 26.0–34.0)
MCHC: 32.9 g/dL (ref 30.0–36.0)
MCV: 84.3 fL (ref 78.0–100.0)
MPV: 9.4 fL (ref 8.6–12.4)
Monocytes Absolute: 0.6 10*3/uL (ref 0.1–1.0)
Monocytes Relative: 12 % (ref 3–12)
Neutro Abs: 2.9 10*3/uL (ref 1.7–7.7)
Neutrophils Relative %: 54 % (ref 43–77)
Platelets: 310 10*3/uL (ref 150–400)
RBC: 4.15 MIL/uL (ref 3.87–5.11)
RDW: 15.3 % (ref 11.5–15.5)
WBC: 5.4 10*3/uL (ref 4.0–10.5)

## 2014-06-15 LAB — COMPREHENSIVE METABOLIC PANEL
ALK PHOS: 84 U/L (ref 39–117)
ALT: 15 U/L (ref 0–35)
AST: 17 U/L (ref 0–37)
Albumin: 3.8 g/dL (ref 3.5–5.2)
BILIRUBIN TOTAL: 0.4 mg/dL (ref 0.2–1.2)
BUN: 9 mg/dL (ref 6–23)
CO2: 29 mEq/L (ref 19–32)
CREATININE: 0.72 mg/dL (ref 0.50–1.10)
Calcium: 9.4 mg/dL (ref 8.4–10.5)
Chloride: 102 mEq/L (ref 96–112)
Glucose, Bld: 89 mg/dL (ref 70–99)
Potassium: 4.5 mEq/L (ref 3.5–5.3)
SODIUM: 139 meq/L (ref 135–145)
Total Protein: 7.1 g/dL (ref 6.0–8.3)

## 2014-06-15 LAB — FERRITIN: FERRITIN: 44 ng/mL (ref 10–291)

## 2014-06-15 LAB — LIPID PANEL
CHOL/HDL RATIO: 4.3 ratio
CHOLESTEROL: 256 mg/dL — AB (ref 0–200)
HDL: 59 mg/dL (ref >39)
LDL Cholesterol: 179 mg/dL — ABNORMAL HIGH (ref 0–99)
Triglycerides: 88 mg/dL (ref <150)
VLDL: 18 mg/dL (ref 0–40)

## 2014-06-15 LAB — TSH: TSH: 2.355 u[IU]/mL (ref 0.350–4.500)

## 2014-06-15 LAB — VITAMIN D 25 HYDROXY (VIT D DEFICIENCY, FRACTURES): VIT D 25 HYDROXY: 23 ng/mL — AB (ref 30–100)

## 2014-06-15 LAB — IRON: Iron: 59 ug/dL (ref 42–145)

## 2014-06-15 NOTE — Patient Instructions (Addendum)
  HEALTH MAINTENANCE RECOMMENDATIONS:  It is recommended that you get at least 30 minutes of aerobic exercise at least 5 days/week (for weight loss, you may need as much as 60-90 minutes). This can be any activity that gets your heart rate up. This can be divided in 10-15 minute intervals if needed, but try and build up your endurance at least once a week.  Weight bearing exercise is also recommended twice weekly.  Eat a healthy diet with lots of vegetables, fruits and fiber.  "Colorful" foods have a lot of vitamins (ie green vegetables, tomatoes, red peppers, etc).  Limit sweet tea, regular sodas and alcoholic beverages, all of which has a lot of calories and sugar.  Up to 1 alcoholic drink daily may be beneficial for women (unless trying to lose weight, watch sugars).  Drink a lot of water.  Calcium recommendations are 1200-1500 mg daily (1500 mg for postmenopausal women or women without ovaries), and vitamin D 1000 IU daily.  This should be obtained from diet and/or supplements (vitamins), and calcium should not be taken all at once, but in divided doses.  Monthly self breast exams and yearly mammograms for women over the age of 53 is recommended.  Sunscreen of at least SPF 30 should be used on all sun-exposed parts of the skin when outside between the hours of 10 am and 4 pm (not just when at beach or pool, but even with exercise, golf, tennis, and yard work!)  Use a sunscreen that says "broad spectrum" so it covers both UVA and UVB rays, and make sure to reapply every 1-2 hours.  Remember to change the batteries in your smoke detectors when changing your clock times in the spring and fall.  Use your seat belt every time you are in a car, and please drive safely and not be distracted with cell phones and texting while driving.  Continue the Mucinex DM.  You may use decongestant (ie sudafed) in addition if you have recurrent sinus pressure/pain.  Call next week if discolored mucus  persists/worsens, for antibiotic; there is no evidence of bacterial infection today.

## 2014-06-15 NOTE — Progress Notes (Signed)
Chief Complaint  Patient presents with  . Annual Exam    fasting annual exam no pap-UTD with Dr.Haygood. Woke up yesterday with cough and congestion.    Margaret Ramirez is a [age] y.o. female who presents for a complete physical. She sees Dr. Pennie RushingHaygood for her GYN care, and reports that she saw her in September (last in Epic was 07/2012?).  She has the following concerns:  Her daughter and granddaughter were sick over Christmas.  Since 12/26 she has had some postnasal drainage, causing some cough, itchy ears, slight burn in throat.  In the early morning she gets discolored phlegm (yellow) but is clear later in the day.  She had pain in both cheeks for the first 2 days, some slight pressure last night, relieved by mucinex.  Isn't having any pain today.  She had chills initially, but no further chills, no known fevers.  H/o reflux esophagitis in 12/2010.  That was treated with Dexilant.  She hasn't been on any PPI medication for at least 2-3 years.  She uses Tums as needed for heartburn.  She has heartburn once or twice each week, triggered by greasy foods.  Denies dysphagia or chest pain.  Denies black bowel movements. She was noted to be anemic (hg 10.2) prior to her knee surgery in September.  She wasn't told to take iron or do anything about it.  Previously had low vitamin D, s/p prescription replacement.  She has been taking MVI daily.  Immunization History  Administered Date(s) Administered  . Influenza Split 05/17/2011  . Influenza-Unspecified 05/16/2014  . Td 12/06/2011  she does not know if she ever had TdaP ([facility] Last Pap smear: 07/2012 with Dr. Pennie RushingHaygood Last mammogram: 06/2013 Last colonoscopy: 12/2010, normal (due again 10 years).  Had EGD at same time (12/2010) Last DEXA: N/A Dentist: twice yearly Ophtho: once a year Exercise: 2x/week walk x 45 minutes on lunch break at work  Past Medical History  Diagnosis Date  . Acute meniscal tear of left knee     s/p  repair   . Wears contact lenses   . Arthritis   . Reflux esophagitis 12/2010    EGD with Dr. Loreta AveMann; grade 1 distal esophagitis and peptic-type duodenopathy on path  . Pemphigus vulgaris     Dr. Lovenia KimSteinhelfer  . Vitamin D deficiency     Past Surgical History  Procedure Laterality Date  . Cesarean section  1992  . Laparscopic right ovarian cystectomy  11-21-1999  . Colonoscopy with esophagogastroduodenoscopy (egd)  01-31-2011  . Knee arthroscopy with medial menisectomy Left 03/14/2014    Procedure: KNEE ARTHROSCOPY WITH MEDIAL MENISECTOMY;  Surgeon: Eugenia Mcalpineobert Collins, MD;  [facility] [place];  Service: Orthopedics;  Laterality: Left;  . Chondroplasty Left 03/14/2014    Procedure: CHONDROPLASTY;  Surgeon: Eugenia Mcalpineobert Collins, MD;  [facility] [place];  Service: Orthopedics;  Laterality: Left;  . Knee arthroscopy Left 03/14/2014    Procedure: ARTHROSCOPY KNEE;  Surgeon: Eugenia Mcalpineobert Collins, MD;  [facility] [place];  Service: Orthopedics;  Laterality: Left;    History   Social History  . Marital Status: Married    Spouse Name: N/A    Number of Children: 2  . Years of Education: N/A   Occupational History  . inspector for machine [employer]    now [employer]   Social History Main Topics  . Smoking status: Never Smoker   . Smokeless tobacco: Never Used  . Alcohol  Use: No  . Drug Use: No  . Sexual Activity:    Partners: Male   Other Topics Concern  . Not on file   Social History Narrative   Married.  Lives with husband.  Children are grown and graduated college, 2 local, 1 in [place] (daughter is step-daughter)    Family History  Problem Relation Age of Onset  . Cancer Mother     lung  . Asthma Mother   . COPD Father     Emphysema  . Heart disease Father   . Hyperlipidemia Sister   . Hyperlipidemia Brother   . Hypertension Brother   . Diabetes Neg Hx   . COPD Paternal Grandfather     Black lung    Outpatient Encounter  Prescriptions as of 06/15/2014  Medication Sig Note  . Multiple Vitamin (MULTIVITAMIN) tablet Take 1 tablet by mouth daily.     . silver sulfADIAZINE (SILVADENE) 1 % cream Apply 1 application topically daily. Use prn pemphigus flare 06/15/2014: Received from: External Pharmacy  . [DISCONTINUED] aspirin EC 325 MG tablet Take 1 tablet (325 mg total) by mouth 2 (two) times daily.   . [DISCONTINUED] HYDROcodone-acetaminophen (NORCO) 5-325 MG per tablet Take 1-2 tablets by mouth every 6 (six) hours as needed for moderate pain.   . [DISCONTINUED] sulfamethoxazole-trimethoprim (BACTRIM DS) 800-160 MG per tablet Take 1 tablet by mouth 2 (two) times daily.     Allergies  Allergen Reactions  . Cefprozil Rash    Review of Systems The patient denies anorexia, fever, headaches, vision changes, decreased hearing, ear pain, sore throat, breast concerns, chest pain, palpitations, dizziness, syncope, dyspnea on exertion, swelling, nausea, vomiting, diarrhea, constipation, abdominal pain, melena, hematochezia, hematuria, incontinence, dysuria, vaginal bleeding, discharge, odor or itch, genital lesions, joint pains, numbness, tingling, weakness, tremor, suspicious skin lesions, depression, anxiety, abnormal bleeding/bruising, or enlarged lymph nodes.  She gained 10 pounds since her knee surgery. Pemphigus vulgaris--flaring some now (has a few blisters).  Using topical cream (tapered off oral steroids). +URI symptoms as per HPI.  PHYSICAL EXAM:  BP 118/72 mmHg  Pulse 60  Temp(Src) 97.2 F (36.2 C) (Tympanic)  Ht 5\' 8"  (1.727 m)  Wt 233 lb (105.688 kg)  BMI 35.44 kg/m2  LMP 11/19/2008  General Appearance:   Alert, cooperative, no distress, appears stated age  Head:   Normocephalic, without obvious abnormality, atraumatic  Eyes:   PERRL, conjunctiva/corneas clear, EOM's intact, fundi   benign  Ears:   Normal TM's and external ear canals  Nose:  Nares normal, mucosa is mildly  edematous, no erythema; clear-white mucus noted.  No sinustenderness  Throat:  Lips, mucosa, and tongue normal; teeth and gums normal. Mild erythema posteriorly from PND  Neck:  Supple, no lymphadenopathy; thyroid: no enlargement/tenderness/nodules; no carotid  bruit or JVD  Back:  Spine nontender, no curvature, ROM normal, no CVA tenderness  Lungs:   Clear to auscultation bilaterally without wheezes, rales or ronchi; respirations unlabored  Chest Wall:   No tenderness or deformity  Heart:   Regular rate and rhythm, S1 and S2 normal, no murmur, rub  or gallop  Breast Exam:   Deferred to GYN  Abdomen:   Soft, non-tender, nondistended, normoactive bowel sounds,   no masses, no hepatosplenomegaly  Genitalia:   Deferred to GYN     Extremities:  No clubbing, cyanosis or edema  Pulses:  2+ and symmetric all extremities  Skin:  Skin color, texture, turgor normal  Lymph nodes:  Cervical, supraclavicular, and axillary nodes  normal  Neurologic:  CNII-XII intact, normal strength, sensation and gait; reflexes 2+ and symmetric throughout   Psych: Normal mood, affect, hygiene and grooming.   ASSESSMENT/PLAN:  Annual physical exam - Plan: POCT Urinalysis Dipstick, Visual acuity screening, Lipid panel, Comprehensive metabolic panel, CBC with Differential, TSH, Vit D  25 hydroxy (rtn osteoporosis monitoring), Ferritin, Iron  Anemia, iron deficiency - she is postmenopausal; if recurrent anemia, likely related to recurrent esophagitis - Plan: CBC with Differential, Ferritin, Iron  Vitamin D deficiency - Plan: Vit D  25 hydroxy (rtn osteoporosis monitoring)  Pure hypercholesterolemia - Plan: Lipid panel  Obesity (BMI 35.0-39.9 without comorbidity)  Discussed monthly self breast exams and yearly mammograms (due in January); at least 30 minutes of aerobic activity at least 5 days/week, weight  bearing exercise at least 2x/wk; proper sunscreen use reviewed; healthy diet, including goals of calcium and vitamin D intake and alcohol recommendations (less than or equal to 1 drink/day) reviewed; regular seatbelt use; changing batteries in smoke detectors. Immunization recommendations discussed. Colonoscopy recommendations reviewed--UTD (normal in 12/2010)  Tdap recommended.  Unsure if she has ever had, just known that the last one given was only Td.  She currently has no exposure to newborns.  Recommend she return for Tdap if will be around babies.  Or can do in 2018.  Counseled extensively re: diet, exercise and weight loss.  [facility] is [facility] pharmacy, in case rx is needed.  We discussed poss of PPI and iron if abnl results, indicating anemia due to suspected esophagitis (given lack of other sources)

## 2014-06-16 MED ORDER — ERGOCALCIFEROL 1.25 MG (50000 UT) PO CAPS: 50000.0000 [IU] | ORAL_CAPSULE | ORAL | Status: DC

## 2014-06-16 NOTE — Addendum Note (Signed)
Addended by: Joselyn Arrow on: 06/16/2014 08:29 PM   Modules accepted: Orders

## 2014-09-06 ENCOUNTER — Other Ambulatory Visit (HOSPITAL_COMMUNITY): Payer: Self-pay | Admitting: Obstetrics and Gynecology

## 2014-09-06 DIAGNOSIS — Z1231 Encounter for screening mammogram for malignant neoplasm of breast: Secondary | ICD-10-CM

## 2014-09-27 ENCOUNTER — Ambulatory Visit (HOSPITAL_COMMUNITY)
Admission: RE | Admit: 2014-09-27 | Discharge: 2014-09-27 | Disposition: A | Discharge status: Home / Self Care | Payer: 59 | Source: Ambulatory Visit | Attending: Obstetrics and Gynecology | Admitting: Obstetrics and Gynecology

## 2014-09-27 DIAGNOSIS — Z1231 Encounter for screening mammogram for malignant neoplasm of breast: Secondary | ICD-10-CM

## 2015-01-02 ENCOUNTER — Other Ambulatory Visit (INDEPENDENT_AMBULATORY_CARE_PROVIDER_SITE_OTHER): Payer: 59

## 2015-01-02 DIAGNOSIS — Z111 Encounter for screening for respiratory tuberculosis: Secondary | ICD-10-CM | POA: Diagnosis not present

## 2015-01-04 LAB — TB SKIN TEST
Induration: 0 mm
TB Skin Test: NEGATIVE

## 2015-06-27 ENCOUNTER — Encounter: Payer: Self-pay | Admitting: Family Medicine

## 2015-06-27 ENCOUNTER — Ambulatory Visit (INDEPENDENT_AMBULATORY_CARE_PROVIDER_SITE_OTHER): Payer: Commercial Managed Care - HMO | Admitting: Family Medicine

## 2015-06-27 ENCOUNTER — Other Ambulatory Visit (HOSPITAL_COMMUNITY)
Admission: RE | Admit: 2015-06-27 | Discharge: 2015-06-27 | Disposition: A | Discharge status: Home / Self Care | Payer: 59 | Source: Ambulatory Visit | Attending: Family Medicine | Admitting: Family Medicine

## 2015-06-27 VITALS — BP 102/70 | HR 68 | Ht 67.25 in | Wt 204.6 lb

## 2015-06-27 DIAGNOSIS — L100 Pemphigus vulgaris: Secondary | ICD-10-CM | POA: Insufficient documentation

## 2015-06-27 DIAGNOSIS — Z23 Encounter for immunization: Secondary | ICD-10-CM

## 2015-06-27 DIAGNOSIS — Z5181 Encounter for therapeutic drug level monitoring: Secondary | ICD-10-CM

## 2015-06-27 DIAGNOSIS — D649 Anemia, unspecified: Secondary | ICD-10-CM

## 2015-06-27 DIAGNOSIS — E559 Vitamin D deficiency, unspecified: Secondary | ICD-10-CM | POA: Diagnosis not present

## 2015-06-27 DIAGNOSIS — E785 Hyperlipidemia, unspecified: Secondary | ICD-10-CM

## 2015-06-27 DIAGNOSIS — Z01419 Encounter for gynecological examination (general) (routine) without abnormal findings: Secondary | ICD-10-CM | POA: Insufficient documentation

## 2015-06-27 DIAGNOSIS — Z1151 Encounter for screening for human papillomavirus (HPV): Secondary | ICD-10-CM | POA: Insufficient documentation

## 2015-06-27 DIAGNOSIS — E669 Obesity, unspecified: Secondary | ICD-10-CM | POA: Insufficient documentation

## 2015-06-27 DIAGNOSIS — L109 Pemphigus, unspecified: Secondary | ICD-10-CM | POA: Insufficient documentation

## 2015-06-27 DIAGNOSIS — R634 Abnormal weight loss: Secondary | ICD-10-CM

## 2015-06-27 DIAGNOSIS — Z0000 Encounter for general adult medical examination without abnormal findings: Secondary | ICD-10-CM

## 2015-06-27 DIAGNOSIS — B001 Herpesviral vesicular dermatitis: Secondary | ICD-10-CM | POA: Diagnosis not present

## 2015-06-27 DIAGNOSIS — E66811 Obesity, class 1: Secondary | ICD-10-CM

## 2015-06-27 LAB — CBC WITH DIFFERENTIAL/PLATELET
BASOS PCT: 2 % — AB (ref 0–1)
Basophils Absolute: 0.1 10*3/uL (ref 0.0–0.1)
Eosinophils Absolute: 0.2 10*3/uL (ref 0.0–0.7)
Eosinophils Relative: 4 % (ref 0–5)
HEMATOCRIT: 35.3 % — AB (ref 36.0–46.0)
HEMOGLOBIN: 11.7 g/dL — AB (ref 12.0–15.0)
LYMPHS PCT: 40 % (ref 12–46)
Lymphs Abs: 2.3 10*3/uL (ref 0.7–4.0)
MCH: 28.2 pg (ref 26.0–34.0)
MCHC: 33.1 g/dL (ref 30.0–36.0)
MCV: 85.1 fL (ref 78.0–100.0)
MONO ABS: 0.5 10*3/uL (ref 0.1–1.0)
MONOS PCT: 9 % (ref 3–12)
MPV: 9 fL (ref 8.6–12.4)
NEUTROS ABS: 2.6 10*3/uL (ref 1.7–7.7)
NEUTROS PCT: 45 % (ref 43–77)
Platelets: 312 10*3/uL (ref 150–400)
RBC: 4.15 MIL/uL (ref 3.87–5.11)
RDW: 14.5 % (ref 11.5–15.5)
WBC: 5.7 10*3/uL (ref 4.0–10.5)

## 2015-06-27 LAB — COMPREHENSIVE METABOLIC PANEL
ALK PHOS: 72 U/L (ref 33–130)
ALT: 17 U/L (ref 6–29)
AST: 17 U/L (ref 10–35)
Albumin: 4.1 g/dL (ref 3.6–5.1)
BUN: 13 mg/dL (ref 7–25)
CALCIUM: 9.3 mg/dL (ref 8.6–10.4)
CHLORIDE: 103 mmol/L (ref 98–110)
CO2: 30 mmol/L (ref 20–31)
Creat: 0.67 mg/dL (ref 0.50–1.05)
GLUCOSE: 89 mg/dL (ref 65–99)
POTASSIUM: 4.5 mmol/L (ref 3.5–5.3)
Sodium: 139 mmol/L (ref 135–146)
Total Bilirubin: 0.4 mg/dL (ref 0.2–1.2)
Total Protein: 7.2 g/dL (ref 6.1–8.1)

## 2015-06-27 LAB — POCT URINALYSIS DIPSTICK
BILIRUBIN UA: NEGATIVE
Blood, UA: NEGATIVE
GLUCOSE UA: NEGATIVE
Ketones, UA: NEGATIVE
LEUKOCYTES UA: NEGATIVE
NITRITE UA: NEGATIVE
Protein, UA: NEGATIVE
Spec Grav, UA: >=1.03
UROBILINOGEN UA: NEGATIVE
pH, UA: 6

## 2015-06-27 LAB — LIPID PANEL
CHOLESTEROL: 248 mg/dL — AB (ref 125–200)
HDL: 72 mg/dL (ref >=46)
LDL CALC: 164 mg/dL — AB (ref <130)
TRIGLYCERIDES: 60 mg/dL (ref <150)
Total CHOL/HDL Ratio: 3.4 Ratio (ref <=5.0)
VLDL: 12 mg/dL (ref <30)

## 2015-06-27 MED ORDER — VALACYCLOVIR HCL 1 G PO TABS: ORAL_TABLET | ORAL | Status: DC

## 2015-06-27 NOTE — Patient Instructions (Addendum)
  HEALTH MAINTENANCE RECOMMENDATIONS:  It is recommended that you get at least 30 minutes of aerobic exercise at least 5 days/week (for weight loss, you may need as much as 60-90 minutes). This can be any activity that gets your heart rate up. This can be divided in 10-15 minute intervals if needed, but try and build up your endurance at least once a week.  Weight bearing exercise is also recommended twice weekly.  Eat a healthy diet with lots of vegetables, fruits and fiber.  "Colorful" foods have a lot of vitamins (ie green vegetables, tomatoes, red peppers, etc).  Limit sweet tea, regular sodas and alcoholic beverages, all of which has a lot of calories and sugar.  Up to 1 alcoholic drink daily may be beneficial for women (unless trying to lose weight, watch sugars).  Drink a lot of water.  Calcium recommendations are 1200-1500 mg daily (1500 mg for postmenopausal women or women without ovaries), and vitamin D 1000 IU daily.  This should be obtained from diet and/or supplements (vitamins), and calcium should not be taken all at once, but in divided doses.  Monthly self breast exams and yearly mammograms for women over the age of 55 is recommended.  Sunscreen of at least SPF 30 should be used on all sun-exposed parts of the skin when outside between the hours of 10 am and 4 pm (not just when at beach or pool, but even with exercise, golf, tennis, and yard work!)  Use a sunscreen that says "broad spectrum" so it covers both UVA and UVB rays, and make sure to reapply every 1-2 hours.  Remember to change the batteries in your smoke detectors when changing your clock times in the spring and fall.  Use your seat belt every time you are in a car, and please drive safely and not be distracted with cell phones and texting while driving.   If your vitamin D level is low again this year, you will need to start an OTC Vitamin D 1000 IU, to be taken daily, long-term, along with your multivitamin (even if a  prescription is also recommended).    You were given TdaP today--the P is for pertussis, whooping cough booster, given your exposure to newborn.  It is hard to say if the lesion on the lip is a cold sore (herpes), which looks different than usual due to using topical steroids, or if it related to pemphigus.  We will treat with Valtrex as if it is a herpes outbreak, to shorten the course.

## 2015-06-27 NOTE — Progress Notes (Signed)
Chief Complaint  Patient presents with  . Annual Exam    fasting annual with pap(last pap was with Dr.Haygood 02/2013) but would like to do here today if possible. Woke up yesterday morning with fever blister and swollen place under her neck. Also has been having small sores in her nose for about a month.    Margaret Ramirez is a 55 y.o. female who presents for a complete physical. She previously saw Dr. Pennie Rushing for her GYN care, but prefers to have it all done here today. She has the following concerns:  She woke up with a blister on her right lower lip yesterday morning.  She also has a swollen gland under her left chin, and is having nasal congestion and right nasal discomfort.  She has never had pemphigus on her face or lip before (has had on the gums before).  She has no h/o fever blisters in the past.  It started like a big round blister. Using clobetasol on the blister. Didn't peel back like a usual pemphigus blister--she isn't really sure. She thinks it was redder yesterday. Denies significant pain.  H/o reflux esophagitis in 12/2010. That was treated with Dexilant. She hasn't been on any PPI medication for at least 3-4 years. Last year she was reporting to have heartburn once or twice each week, triggered by greasy foods, relieved by Tums.  Since changing her diet and losing weight, she no longer has any problems with heartburn. Denies dysphagia or chest pain.   She was noted to be anemic (hg 10.2) prior to her knee surgery in September 2015. She wasn't told to take iron or do anything about it. Recheck at her CPE last year showed improvement, but still mild anemia with Hg 11.5.  Vitamin D deficiency--was found to be low again (level of 23) last year, and treated with another course of prescription Vitamin D.  This was with her taking a MVI daily.  She added an OTC Vitamin D supplement which she took daily for about a month, but hasn't continued to take Vitamin D separately, just the  MVI.  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  Had a flare requiring prednisone for 3 months.  Has tapered off completely, and is doing well on Cellcept twice daily along with clobetasol.  She regained 15# while on prednisone (had lost about 50# since last year).  She gets labs monitored through derm every 3-5 months--checks iron, liver tests. She was told her iron had been borderline, but didn't need to take any iron.  Immunization History  Administered Date(s) Administered  . Influenza Split 05/17/2011  . Influenza-Unspecified 05/16/2014, 05/21/2015  . PPD Test 01/02/2015  . Td 12/06/2011   she does not know if she ever had TdaP (Td was given at [facility] for laceration) Last Pap smear: 07/2012 with Dr. Pennie Rushing Last mammogram: 09/2014 Last colonoscopy: 12/2010, normal (due again 10 years). Had EGD at same time (12/2010) Last DEXA: N/A Dentist: twice yearly Ophtho: once a year Exercise: [employer] 3-5x/week--treadmill or elliptical, plus circuit (resistance).  Past Medical History  Diagnosis Date  . Acute meniscal tear of left knee     s/p repair   . Wears contact lenses   . Arthritis   . Reflux esophagitis 12/2010    EGD with Dr. Loreta Ave; grade 1 distal esophagitis and peptic-type duodenopathy on path  . Pemphigus vulgaris     Dr. Lovenia Kim  . Vitamin D deficiency     Past Surgical History  Procedure Laterality  Date  . Cesarean section  1992  . Laparscopic right ovarian cystectomy  11-21-1999  . Colonoscopy with esophagogastroduodenoscopy (egd)  01-31-2011  . Knee arthroscopy with medial menisectomy Left 03/14/2014    Procedure: KNEE ARTHROSCOPY WITH MEDIAL MENISECTOMY;  Surgeon: Eugenia Mcalpine, MD;  Location: [facility];  Service: Orthopedics;  Laterality: Left;  . Chondroplasty Left 03/14/2014    Procedure: CHONDROPLASTY;  Surgeon: Eugenia Mcalpine, MD;  Location: [facility];  Service: Orthopedics;  Laterality: Left;  . Knee  arthroscopy Left 03/14/2014    Procedure: ARTHROSCOPY KNEE;  Surgeon: Eugenia Mcalpine, MD;  Location: [facility];  Service: Orthopedics;  Laterality: Left;    Social History   Social History  . Marital Status: Married    Spouse Name: N/A  . Number of Children: 2  . Years of Education: N/A   Occupational History  . 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 Smoking status: Never Smoker   . Smokeless tobacco: Never Used  . Alcohol Use: No  . Drug Use: No  . Sexual Activity:    Partners: Male   Other Topics Concern  . Not on file   Social History Narrative   Married.  Lives with husband.  Children are grown and graduated college, son in [place], [place], other son will be moving to [place] 07/2015  (daughter is step-daughter, lives locally).  3 grandchildren (will be moving to [facility]).   She retired from [employer] in 06/2015.    Family History  Problem Relation Age of Onset  . Cancer Mother     lung  . Asthma Mother   . COPD Father     Emphysema  . Heart disease Father   . Hyperlipidemia Sister   . Hyperlipidemia Brother   . Hypertension Brother   . Diabetes Neg Hx   . COPD Paternal Grandfather     Black lung    Outpatient Encounter Prescriptions as of 06/27/2015  Medication Sig Note  . clobetasol ointment (TEMOVATE) 0.05 % Apply 1 application topically 2 (two) times daily.   . Multiple Vitamin (MULTIVITAMIN) tablet Take 1 tablet by mouth daily.     . mycophenolate (CELLCEPT) 500 MG tablet Take 500 mg by mouth 2 (two) times daily.   . silver sulfADIAZINE (SILVADENE) 1 % cream Apply 1 application topically daily. Use prn pemphigus flare 06/15/2014: Received from: External Pharmacy  . [DISCONTINUED] ergocalciferol (DRISDOL) 50000 UNITS capsule Take 1 capsule (50,000 Units total) by mouth once a week.    No facility-administered encounter medications on file as of 06/27/2015.    Allergies  Allergen  Reactions  . Cefprozil Rash    Review of Systems The patient denies anorexia, fever, headaches, vision changes, decreased hearing, ear pain, sore throat, breast concerns, chest pain, palpitations, dizziness, syncope, dyspnea on exertion, swelling, nausea, vomiting, diarrhea, constipation, abdominal pain, melena, hematochezia, hematuria, incontinence, dysuria, vaginal bleeding, discharge, odor or itch, genital lesions, joint pains, numbness, tingling, weakness, tremor, suspicious skin lesions, depression, anxiety, abnormal bleeding/bruising, or enlarged lymph nodes. No insomnia, but just recently retired, adjusting to coming off of third shift.  +weight loss--down 29# since her visit last year. She actually lost 15# more, but regained when put back on prednisone for flare of pemphigus. +uri symptoms with head congestion (both cheeks), sores in her nose; fever blister on her lower right lip, as per HPI   PHYSICAL EXAM:  BP 102/70 mmHg  Pulse  68  Ht 5' 7.25" (1.708 m)  Wt 204 lb 9.6 oz (92.806 kg)  BMI 31.81 kg/m2  LMP 11/19/2008  General Appearance:   Alert, cooperative, no distress, appears stated age  Head:   Normocephalic, without obvious abnormality, atraumatic  Eyes:   PERRL, conjunctiva/corneas clear, EOM's intact, fundi   benign  Ears:   Normal TM's and external ear canals  Nose:  Nares normal; Nasal mucosa is mild-mod edematous, no erythema or purulence. No sinustenderness  Throat: Mucosa, and tongue normal; teeth and gums normal. No erythema of posterior OP. Right lateral lower lip--there is soft tissue inflammation measuring 1cm--not erythematous. Central 1/2 cm of this area has overlying slight scab  Neck:  Supple, no lymphadenopathy; thyroid: no enlargement/tenderness/nodules; no carotid  bruit or JVD. Area of discomfort is under chin on right--no palpable mass or lymph node in this area  Back:  Spine nontender, no curvature, ROM  normal, no CVA tenderness  Lungs:   Clear to auscultation bilaterally without wheezes, rales or ronchi; respirations unlabored  Chest Wall:   No tenderness or deformity  Heart:   Regular rate and rhythm, S1 and S2 normal, no murmur, rub  or gallop  Breast Exam:   no nipple inversion, nipple discharge, skin dimpling, breast mass or axillary lymphadenopathy  Abdomen:   Soft, non-tender, nondistended, normoactive bowel sounds,   no masses, no hepatosplenomegaly  Genitalia:   normal external genitalia without lesions. Normal BUS, vagina. No abnormal vaginal discharge. Cervix is normal, without lesions. Pap obtained. Uterus is normal, not enlarged, nontender. No adnexal masses or tenderness  Rectal: normal sphincter tone. No rectal mass.  Heme negative brown stool  Extremities:  No clubbing, cyanosis or edema  Pulses:  2+ and symmetric all extremities  Skin:  Skin color, texture, turgor normal. Healing/scabbed lesions on upper abdomen/chest, and back. Lip lesion as stated above  Lymph nodes:  Cervical, supraclavicular, and axillary nodes normal  Neurologic:  CNII-XII intact, normal strength, sensation and gait; reflexes 2+ and symmetric throughout   Psych: Normal mood, affect, hygiene and grooming       ASSESSMENT/PLAN:  Annual physical exam - Plan: POCT Urinalysis Dipstick, Visual acuity screening, Lipid panel, Comprehensive metabolic panel, CBC with Differential/Platelet, VITAMIN D 25 Hydroxy (Vit-D Deficiency, Fractures), TSH, Cytology - PAP [place]  Obesity (BMI 30.0-34.9) - congratulated on weight loss, continue  Pemphigus vulgaris - improved on Cellcept. Unclear if lip lesion is pemphigus lesion vs partially treated HSV  Hyperlipidemia - high last year. Significant dietary changes made since then, repeat today - Plan: Lipid panel  Loss of weight - Plan: TSH  Vitamin D deficiency -  discussed need for longterm OTC supplementation. 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 doesn't appear like typical HSV, but use of steroid could change appearance. Treat with Valtrex to cover HSV possibility - Plan: valACYclovir (VALTREX) 1000 MG tablet    Suspect possible HSV-1, but without the typical surrounding erythema, possibly due to topical steroid use.  Will cover with Valtrex to be safe. (vs pemphigous).   Discussed monthly self breast exams and yearly mammograms; at least 30 minutes of aerobic activity at least 5 days/week, weight bearing exercise at least 2x/wk; proper sunscreen use reviewed; healthy diet, including goals  of calcium and vitamin D intake and alcohol recommendations (less than or equal to 1 drink/day) reviewed; regular seatbelt use; changing batteries in smoke detectors. Immunization recommendations discussed--Tdap today (since she is helping care for her 48 week old granddaughter). Colonoscopy recommendations reviewed--UTD (normal in 12/2010)  F/u 1 year, sooner prn

## 2015-06-28 LAB — TSH: TSH: 1.865 u[IU]/mL (ref 0.350–4.500)

## 2015-06-28 LAB — VITAMIN D 25 HYDROXY (VIT D DEFICIENCY, FRACTURES): VIT D 25 HYDROXY: 26 ng/mL — AB (ref 30–100)

## 2015-07-03 LAB — CYTOLOGY - PAP

## 2015-08-01 ENCOUNTER — Telehealth: Payer: Self-pay | Admitting: Family Medicine

## 2015-08-01 NOTE — Telephone Encounter (Signed)
[facility] called for copies of labs, these were faxed to Kim at [facility] [phone]

## 2015-10-11 ENCOUNTER — Ambulatory Visit (INDEPENDENT_AMBULATORY_CARE_PROVIDER_SITE_OTHER): Payer: Commercial Managed Care - HMO | Admitting: Family Medicine

## 2015-10-11 ENCOUNTER — Encounter: Payer: Self-pay | Admitting: Family Medicine

## 2015-10-11 VITALS — BP 108/72 | HR 60 | Temp 99.5°F | Resp 16 | Ht 67.25 in | Wt 205.0 lb

## 2015-10-11 DIAGNOSIS — R6889 Other general symptoms and signs: Secondary | ICD-10-CM | POA: Diagnosis not present

## 2015-10-11 DIAGNOSIS — R059 Cough, unspecified: Secondary | ICD-10-CM

## 2015-10-11 DIAGNOSIS — R05 Cough: Secondary | ICD-10-CM

## 2015-10-11 LAB — POC INFLUENZA A&B (BINAX/QUICKVUE)
INFLUENZA A, POC: NEGATIVE
INFLUENZA B, POC: NEGATIVE

## 2015-10-11 MED ORDER — BENZONATATE 100 MG PO CAPS: 100.0000 mg | ORAL_CAPSULE | Freq: Three times a day (TID) | ORAL | Status: DC | PRN

## 2015-10-11 MED ORDER — AZITHROMYCIN 250 MG PO TABS: ORAL_TABLET | ORAL | Status: DC

## 2015-10-11 NOTE — Patient Instructions (Signed)
Start the antibiotic today, take Tessalon for cough as needed,  may also take Delsym. Stay hydrated. If you are not improving and back to normal by day 10 or if you get worse,  let me know.  I recommend continuing on Allegra for your allergies.   Cough, Adult Coughing is a reflex that clears your throat and your airways. Coughing helps to heal and protect your lungs. It is normal to cough occasionally, but a cough that happens with other symptoms or lasts a long time may be a sign of a condition that needs treatment. A cough may last only 2-3 weeks (acute), or it may last longer than 8 weeks (chronic). CAUSES Coughing is commonly caused by:  Breathing in substances that irritate your lungs.  A viral or bacterial respiratory infection.  Allergies.  Asthma.  Postnasal drip.  Smoking.  Acid backing up from the stomach into the esophagus (gastroesophageal reflux).  Certain medicines.  Chronic lung problems, including COPD (or rarely, lung cancer).  Other medical conditions such as heart failure. HOME CARE INSTRUCTIONS  Pay attention to any changes in your symptoms. Take these actions to help with your discomfort:  Take medicines only as told by your health care provider.  If you were prescribed an antibiotic medicine, take it as told by your health care provider. Do not stop taking the antibiotic even if you start to feel better.  Talk with your health care provider before you take a cough suppressant medicine.  Drink enough fluid to keep your urine clear or pale yellow.  If the air is dry, use a cold steam vaporizer or humidifier in your bedroom or your home to help loosen secretions.  Avoid anything that causes you to cough at work or at home.  If your cough is worse at night, try sleeping in a semi-upright position.  Avoid cigarette smoke. If you smoke, quit smoking. If you need help quitting, ask your health care provider.  Avoid caffeine.  Avoid alcohol.  Rest as  needed. SEEK MEDICAL CARE IF:   You have new symptoms.  You cough up pus.  Your cough does not get better after 2-3 weeks, or your cough gets worse.  You cannot control your cough with suppressant medicines and you are losing sleep.  You develop pain that is getting worse or pain that is not controlled with pain medicines.  You have a fever.  You have unexplained weight loss.  You have night sweats. SEEK IMMEDIATE MEDICAL CARE IF:  You cough up blood.  You have difficulty breathing.  Your heartbeat is very fast.   This information is not intended to replace advice given to you by your health care provider. Make sure you discuss any questions you have with your health care provider.   Document Released: 11/29/2010 Document Revised: 02/21/2015 Document Reviewed: 08/09/2014 Elsevier Interactive Patient Education [employer]2016 Elsevier Inc.

## 2015-10-11 NOTE — Progress Notes (Signed)
   Subjective:    Patient ID: Margaret Ramirez, female    DOB: 12/09/1960, 55 y.o.   MRN: [id number]  HPI Chief Complaint  Patient presents with  . Cough    started this past Monday with itchy ears and ST. Had some chills last night, but no temp-today had temp of 101. Mucus has been clear but today changed to green in color. Throat is no longer sore. Coughed so much last night that she did vomit.   She is here with complaints of a 4 day history of URI symptoms that started with sore throat, nasal congestion, clear nasal drainage, post nasal drainage, and dry cough. States that her cough has progressively worsened to a productive cough. States she has been taking Tylenol and Delsym. Cough is worst part of her illness.  History of allergies and is taking daily Allegra.  Drinking plenty of fluids.  Denies smoking. No history of asthma or COPD. Does report history of bronchitis and pneumonia but in distant past. No recent antibiotic use.  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  No sick contacts.  Denies ear pain, sore throat, chest pain, palpitations, DOE, orthopnea, nausea, diarrhea, abdominal pain, or GU symptoms.   Past Medical History  Diagnosis Date  . Acute meniscal tear of left knee     s/p repair   . Wears contact lenses   . Arthritis   . Reflux esophagitis 12/2010    EGD with Dr. Loreta AveMann; grade 1 distal esophagitis and peptic-type duodenopathy on path  . Pemphigus vulgaris     Dr. Lovenia KimSteinhelfer  . Vitamin D deficiency     Review of Systems Pertinent positives and negatives in the history of present illness.     Objective:   Physical Exam  BP 108/72 mmHg  Pulse 60  Temp(Src) 99.5 F (37.5 C) (Tympanic)  Resp 16  Ht 5' 7.25" (1.708 m)  Wt 205 lb (92.987 kg)  BMI 31.87 kg/m2  LMP 11/19/2008  Alert and in no distress.no sinus tenderness. Tympanic membranes and canals are normal. Pharyngeal area is erythematous. Neck is supple without adenopathy or  thyromegaly but mild tenderness to anterior cervical nodes bilaterally. Cardiac exam shows a regular sinus rhythm without murmurs or gallops. Lungs are clear to auscultation. Abdomen soft, nontender, normal bowel sounds, no rebound, guarding or referred pain.   Flu swab: negative    Assessment & Plan:  Flu-like symptoms - Plan: POC Influenza A&B (Binax test)  Cough - Plan: azithromycin (ZITHROMAX Z-PAK) 250 MG tablet, benzonatate (TESSALON) 100 MG capsule  Discussed that she appears to have bronchitis and I am treating her with Zithromax. She states she has taken this in the past and done well with it. Offered chest XR due to fever and productive cough but patient declined and I am ok with this since it would not change treatment plan. Tessalon prescribed for cough. Recommend tylenol for fever or pain.  Discussed staying well hydrated and letting me know if she is not improving or gets worse.  Follow up as needed.

## 2015-10-19 ENCOUNTER — Telehealth: Payer: Self-pay | Admitting: Family Medicine

## 2015-10-19 MED ORDER — ALBUTEROL SULFATE 108 (90 BASE) MCG/ACT IN AEPB: 2.0000 | INHALATION_SPRAY | Freq: Four times a day (QID) | RESPIRATORY_TRACT | Status: DC | PRN

## 2015-10-19 NOTE — Telephone Encounter (Signed)
Pt called and states that she is still having a non productive cough, it is keeping her up at night, she finished her antibiotic up 3 days ago, she states that Vickie was going to give her a inhaler if the cough didn't ger any better, she states that she feels like she is having trouble getting air. She is not having anything come up with the cough,States the other symptoms are better, informed pt that Larene BeachVickie was out of the office today, pt uses [id number] - [place], [place] - [address] and pt can be reached at [phone] (M)

## 2015-10-19 NOTE — Telephone Encounter (Signed)
Let her know that I call the medication in

## 2015-10-19 NOTE — Telephone Encounter (Signed)
Please call in albuterol inhaler for her if I did not do this. If she needs to be seen get her in please. thanks

## 2015-11-19 ENCOUNTER — Encounter: Payer: Self-pay | Admitting: Family Medicine

## 2015-11-19 ENCOUNTER — Ambulatory Visit (INDEPENDENT_AMBULATORY_CARE_PROVIDER_SITE_OTHER): Payer: Commercial Managed Care - HMO | Admitting: Family Medicine

## 2015-11-19 VITALS — BP 106/68 | HR 72 | Ht 67.25 in | Wt 204.0 lb

## 2015-11-19 DIAGNOSIS — R41840 Attention and concentration deficit: Secondary | ICD-10-CM | POA: Diagnosis not present

## 2015-11-19 DIAGNOSIS — M533 Sacrococcygeal disorders, not elsewhere classified: Secondary | ICD-10-CM | POA: Diagnosis not present

## 2015-11-19 DIAGNOSIS — M545 Low back pain: Secondary | ICD-10-CM | POA: Diagnosis not present

## 2015-11-19 LAB — POCT URINALYSIS DIPSTICK
BILIRUBIN UA: NEGATIVE
GLUCOSE UA: NEGATIVE
Ketones, UA: NEGATIVE
Leukocytes, UA: NEGATIVE
NITRITE UA: NEGATIVE
PH UA: 5.5
Protein, UA: NEGATIVE
RBC UA: NEGATIVE
SPEC GRAV UA: 1.025
Urobilinogen, UA: NEGATIVE

## 2015-11-19 NOTE — Progress Notes (Signed)
Chief Complaint  Patient presents with  . Advice Only    her boss has asked her recently if she has ADD. Feels like she cannot focus, and cannot stay on track and complete tasks-this is only since menopause.   . Back Pain    has had LBP/tailbone pain since she went on a cruise this past April. Only hurts when seated.    She retired from [employer], and started working in [employer] at [employer].  Her manager/friend mentioned that she looks like someone with ADD.  She (friend) had ADD develop after menopause. She notices that she seems to do multiple things at once.  She reports having trouble staying on task, finishing things, mind "goes 100 mph on something else", can't stay on task. But she always finishes her tasks, gets things done at work. She makes lists, which helps.  She asked her brother, who reminded her that she was always told to "focus!"  She gets her job done, not affecting her performance on the job. She doesn't always get her things at home done.  She gets overwhelmed by messes at home.  Other changes at home--husband also recently retired. Denies depression, anxiety, change in sleep.  Since a cruise in early April, she has developed pain in her bottom, at the tip of her tailbone.  Denies any fall, trauma, injury.  Described as a burning pain, resolved when she stands up. No back pain, denies radiation into the legs. Sometimes has some stiffness in her joints in her legs.  When she first noticed it, she was aware that the seats were very hard. Needs to change position frequently at home, even on the couch, to get comfortable. Hasn't tried any medications for it.  PMH, PSH, SH reviewed  Outpatient Encounter Prescriptions as of 11/19/2015  Medication Sig Note  . acetaminophen (TYLENOL) 325 MG tablet Take 650 mg by mouth every 6 (six) hours as needed. 10/11/2015: Last dose today noon.  . mycophenolate (CELLCEPT) 500 MG tablet Take 500 mg by mouth daily.   11/19/2015: One every other day until gone.  . clobetasol ointment (TEMOVATE) 0.05 % Apply 1 application topically 2 (two) times daily. Reported on 11/19/2015   . Multiple Vitamin (MULTIVITAMIN) tablet Take 1 tablet by mouth daily. Reported on 11/19/2015   . silver sulfADIAZINE (SILVADENE) 1 % cream Apply 1 application topically daily. Reported on 11/19/2015 06/15/2014: Received from: External Pharmacy  . [DISCONTINUED] Albuterol Sulfate (PROAIR RESPICLICK) 108 (90 Base) MCG/ACT AEPB Inhale 2 puffs into the lungs 4 (four) times daily as needed.   . [DISCONTINUED] azithromycin (ZITHROMAX Z-PAK) 250 MG tablet Take 2 tablets on day 1, then 1 tablet on days 2-5.   . [DISCONTINUED] benzonatate (TESSALON) 100 MG capsule Take 1 capsule (100 mg total) by mouth 3 (three) times daily as needed for cough.   . [DISCONTINUED] clobetasol cream (TEMOVATE) 0.05 % Reported on 11/19/2015 11/19/2015: Received from: External Pharmacy  . [DISCONTINUED] valACYclovir (VALTREX) 1000 MG tablet Take 2 tablets at onset of cold sore.  Repeat once in 12 hours (Patient not taking: Reported on 10/11/2015)    No facility-administered encounter medications on file as of 11/19/2015.   Allergies  Allergen Reactions  . Cefzil [Cefprozil] Rash    On thighs and forarms.   ROS: no fever, chills, bleeding, bruising, rash (pemphigus is stable), numbness, tinglling, weakness. Denies urinary complaints, bowel/bladder dysfunction. Denies depression, anxiety, insomnia, hyperactivity. See HPI  PHYSICAL EXAM: BP 106/68 mmHg  Pulse 72  Ht 5'  7.25" (1.708 m)  Wt 204 lb (92.534 kg)  BMI 31.72 kg/m2  LMP 11/19/2008  Well developed, pleasant female in no distress. She does not demonstrate any hyperactivity or jumping from topic to topic during her visit. She stays on task, with good attention. Normal eye contact, speech, hygiene, grooming, mood and affect  Buttocks:  Tender over the coccyx.  No erythema, soft tissue swelling, or induration. Skin is  intact without rashes or lesions  ASSESSMENT/PLAN:  Coccyx pain - NSAIDs, padded (donut) cushion. 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 Consider testing if worse  Low back pain, unspecified back pain laterality, with sciatica presence unspecified - Plan: POCT Urinalysis Dipstick   Be sure to get at least 8 hours of sleep each night. If you find that you are having trouble sleeping, whether it be related to pain, or night sweats, or anxiety, or anything else, this could contribute to concentration problems during the day, and we should discuss further.  It does sound as that you have a long history of some problems with concentration, but none that have effected you in any way to require any special treatment, medications. It doesn't sound as though you are having any problems now that would warrant evaluation with specific ADD testing, or medication. If things change, please let us know.  If you are not able to complete tasks on time, getting in trouble for not fulfilling your job or poor quality of work on the job, marital issues related to incompleted tasks, etc, these would be situations for which we might consider additional evaluation. In the mean time, continue with the measures that already have in place--keeping lists (to do, grocery, etc).  Use Aleve twice daily for 7-10 days to help with the pain in your bottom. Try using a donut-shaped pillow to sit on, to avoid direct pressure on your tailbone. If the pain is worsening, we can get an x-ray to rule out underlying bone cause.   35 min visit, more than 1/2 spent counseling

## 2015-11-19 NOTE — Patient Instructions (Signed)
   Be sure to get at least 8 hours of sleep each night. If you find that you are having trouble sleeping, whether it be related to pain, or night sweats, or anxiety, or anything else, this could contribute to concentration problems during the day, and we should discuss further.  It does sound as that you have a long history of some problems with concentration, but none that have effected you in any way to require any special treatment, medications. It doesn't sound as though you are having any problems now that would warrant evaluation with specific ADD testing, or medication. If things change, please let us know.  If you are not able to complete tasks on time, getting in trouble for not fulfilling your job or poor quality of work on the job, marital issues related to incompleted tasks, etc, these would be situations for which we might consider additional evaluation. In the mean time, continue with the measures that already have in place--keeping lists (to do, grocery, etc).  Use Aleve twice daily for 7-10 days to help with the pain in your bottom. Try using a donut-shaped pillow to sit on, to avoid direct pressure on your tailbone. If the pain is worsening, we can get an x-ray to rule out underlying bone cause.

## 2016-06-08 ENCOUNTER — Encounter (HOSPITAL_COMMUNITY): Payer: Self-pay | Admitting: Family Medicine

## 2016-06-08 ENCOUNTER — Ambulatory Visit (HOSPITAL_COMMUNITY)
Admission: EM | Admit: 2016-06-08 | Discharge: 2016-06-08 | Disposition: A | Discharge status: Home / Self Care | Payer: Commercial Managed Care - HMO | Attending: Family Medicine | Admitting: Family Medicine

## 2016-06-08 DIAGNOSIS — J40 Bronchitis, not specified as acute or chronic: Secondary | ICD-10-CM

## 2016-06-08 MED ORDER — AZITHROMYCIN 250 MG PO TABS: 250.0000 mg | ORAL_TABLET | Freq: Every day | ORAL | 0 refills | Status: DC

## 2016-06-08 MED ORDER — HYDROCODONE-HOMATROPINE 5-1.5 MG/5ML PO SYRP: 5.0000 mL | ORAL_SOLUTION | Freq: Four times a day (QID) | ORAL | 0 refills | Status: DC | PRN

## 2016-06-08 NOTE — ED Provider Notes (Signed)
[facility]    CSN: [id number] Arrival date & time: 06/08/16  1448     History   Chief Complaint Chief Complaint  Patient presents with  . Cough    HPI Margaret Ramirez is a 55 y.o. female.   There is a 55 year old woman who comes in with 5 days of progressive cough since dry throat. She's not had a fever area  She's been off immunosuppressants now for 2 months, having taken them for pemphigus vulgaris in the past.      Past Medical History:  Diagnosis Date  . Acute meniscal tear of left knee    s/p repair   . Arthritis   . Pemphigus vulgaris    Dr. Lovenia KimSteinhelfer  . Reflux esophagitis 12/2010   EGD with Dr. Loreta AveMann; grade 1 distal esophagitis and peptic-type duodenopathy on path  . Vitamin D deficiency   . Wears contact lenses     Patient Active Problem List   Diagnosis Date Noted  . Obesity (BMI 30.0-34.9) 06/27/2015  . Pemphigus vulgaris 06/27/2015  . S/P left knee arthroscopy 03/14/2014  . Menopausal symptom 07/26/2012  . Esophagitis 02/03/2011    Past Surgical History:  Procedure Laterality Date  . CESAREAN SECTION  1992  . CHONDROPLASTY Left 03/14/2014   Procedure: CHONDROPLASTY;  Surgeon: Eugenia Mcalpineobert Collins, MD;  [facility] [place];  Service: Orthopedics;  Laterality: Left;  . COLONOSCOPY WITH ESOPHAGOGASTRODUODENOSCOPY (EGD)  01-31-2011  . KNEE ARTHROSCOPY Left 03/14/2014   Procedure: ARTHROSCOPY KNEE;  Surgeon: Eugenia Mcalpineobert Collins, MD;  [facility] [place];  Service: Orthopedics;  Laterality: Left;  . KNEE ARTHROSCOPY WITH MEDIAL MENISECTOMY Left 03/14/2014   Procedure: KNEE ARTHROSCOPY WITH MEDIAL MENISECTOMY;  Surgeon: Eugenia Mcalpineobert Collins, MD;  [facility] [place];  Service: Orthopedics;  Laterality: Left;  . LAPARSCOPIC RIGHT OVARIAN CYSTECTOMY  11-21-1999    OB History    Gravida Para Term Preterm AB Living   2 2       2    SAB TAB Ectopic Multiple Live Births           2       Home Medications     Prior to Admission medications   Medication Sig Start Date End Date Taking? Authorizing Provider  acetaminophen (TYLENOL) 325 MG tablet Take 650 mg by mouth every 6 (six) hours as needed.   Yes Historical Provider, MD  clobetasol ointment (TEMOVATE) 0.05 % Apply 1 application topically 2 (two) times daily. Reported on 11/19/2015   Yes Historical Provider, MD  Multiple Vitamin (MULTIVITAMIN) tablet Take 1 tablet by mouth daily. Reported on 11/19/2015   Yes Historical Provider, MD  mycophenolate (CELLCEPT) 500 MG tablet Take 500 mg by mouth daily.    Yes Historical Provider, MD  silver sulfADIAZINE (SILVADENE) 1 % cream Apply 1 application topically daily. Reported on 11/19/2015 05/16/14  Yes Historical Provider, MD  azithromycin (ZITHROMAX) 250 MG tablet Take 1 tablet (250 mg total) by mouth daily. Take first 2 tablets together, then 1 every day until finished. 06/08/16   Elvina SidleKurt Nyeemah Jennette, MD  HYDROcodone-homatropine [facility](HYCODAN) 5-1.5 MG/5ML syrup Take 5 mLs by mouth every 6 (six) hours as needed for cough. 06/08/16   Elvina SidleKurt Amanuel Sinkfield, MD    Family History Family History  Problem Relation Age of Onset  . Cancer Mother     lung  . Asthma Mother   . COPD Father     Emphysema  . Heart disease Father   . Hyperlipidemia Sister   .  Hyperlipidemia Brother   . Hypertension Brother   . COPD Paternal Grandfather     Black lung  . Diabetes Neg Hx     Social History Social History  Substance Use Topics  . Smoking status: Never Smoker  . Smokeless tobacco: Never Used  . Alcohol use No     Allergies   Cefzil [cefprozil]   Review of Systems Review of Systems  Constitutional: Negative.   HENT: Negative.   Respiratory: Positive for cough.   Gastrointestinal: Negative.   Neurological: Negative.      Physical Exam Triage Vital Signs ED Triage Vitals  Enc Vitals Group     BP 06/08/16 1505 123/77     Pulse Rate 06/08/16 1505 82     Resp 06/08/16 1505 16     Temp 06/08/16 1505 97.8 F  (36.6 C)     Temp Source 06/08/16 1505 Oral     SpO2 06/08/16 1505 95 %     Weight --      Height --      Head Circumference --      Peak Flow --      Pain Score 06/08/16 1507 0     Pain Loc --      Pain Edu? --      Excl. in GC? --    No data found.   Updated Vital Signs BP 123/77 (BP Location: Right Arm)   Pulse 82   Temp 97.8 F (36.6 C) (Oral)   Resp 16   LMP 11/19/2008   SpO2 95%    Physical Exam  Constitutional: She is oriented to person, place, and time. She appears well-developed and well-nourished.  HENT:  Head: Normocephalic.  Right Ear: External ear normal.  Left Ear: External ear normal.  Mouth/Throat: Oropharynx is clear and moist.  Eyes: Conjunctivae are normal.  Neck: Normal range of motion. Neck supple.  Cardiovascular: Normal rate, regular rhythm and normal heart sounds.   Pulmonary/Chest: Effort normal. She has rales.  Musculoskeletal: Normal range of motion.  Neurological: She is alert and oriented to person, place, and time.  Skin: Skin is warm and dry.  Nursing note and vitals reviewed.    UC Treatments / Results  Labs (all labs ordered are listed, but only abnormal results are displayed) Labs Reviewed - No data to display  EKG  EKG Interpretation None       Radiology No results found.  Procedures Procedures (including critical care time)  Medications Ordered in UC Medications - No data to display   Initial Impression / Assessment and Plan / UC Course  I have reviewed the triage vital signs and the nursing notes.  Pertinent labs & imaging results that were available during my care of the patient were reviewed by me and considered in my medical decision making (see chart for details).  Clinical Course     Final Clinical Impressions(s) / UC Diagnoses   Final diagnoses:  Bronchitis    New Prescriptions New Prescriptions   AZITHROMYCIN (ZITHROMAX) 250 MG TABLET    Take 1 tablet (250 mg total) by mouth daily. Take first  2 tablets together, then 1 every day until finished.   HYDROCODONE-HOMATROPINE (HYCODAN) 5-1.5 MG/5ML SYRUP    Take 5 mLs by mouth every 6 (six) hours as needed for cough.     Elvina SidleKurt Rochell Mabie, MD 06/08/16 [id number]

## 2016-06-08 NOTE — ED Triage Notes (Signed)
Since Tuesday has been wheezing, coughing, no fever, facial pain and body aches. Has been taking mucinex and Halls cough drops.

## 2016-06-26 ENCOUNTER — Encounter: Payer: Commercial Managed Care - HMO | Admitting: Family Medicine

## 2016-12-24 ENCOUNTER — Encounter: Payer: Commercial Managed Care - HMO | Admitting: Family Medicine

## 2017-04-30 ENCOUNTER — Encounter: Payer: Self-pay | Admitting: *Deleted

## 2017-07-22 ENCOUNTER — Encounter: Payer: Commercial Managed Care - HMO | Admitting: Family Medicine

## 2017-09-30 LAB — HM MAMMOGRAPHY

## 2017-10-04 ENCOUNTER — Ambulatory Visit (HOSPITAL_COMMUNITY)
Admission: EM | Admit: 2017-10-04 | Discharge: 2017-10-04 | Disposition: A | Discharge status: Home / Self Care | Payer: 59 | Attending: Urgent Care | Admitting: Urgent Care

## 2017-10-04 ENCOUNTER — Other Ambulatory Visit: Payer: Self-pay

## 2017-10-04 ENCOUNTER — Encounter (HOSPITAL_COMMUNITY): Payer: Self-pay

## 2017-10-04 DIAGNOSIS — J069 Acute upper respiratory infection, unspecified: Secondary | ICD-10-CM

## 2017-10-04 DIAGNOSIS — B9789 Other viral agents as the cause of diseases classified elsewhere: Secondary | ICD-10-CM | POA: Diagnosis not present

## 2017-10-04 DIAGNOSIS — Z9109 Other allergy status, other than to drugs and biological substances: Secondary | ICD-10-CM

## 2017-10-04 MED ORDER — HYDROCODONE-HOMATROPINE 5-1.5 MG/5ML PO SYRP: 5.0000 mL | ORAL_SOLUTION | Freq: Every evening | ORAL | 0 refills | Status: DC | PRN

## 2017-10-04 MED ORDER — ALBUTEROL SULFATE HFA 108 (90 BASE) MCG/ACT IN AERS: 1.0000 | INHALATION_SPRAY | Freq: Four times a day (QID) | RESPIRATORY_TRACT | 0 refills | Status: DC | PRN

## 2017-10-04 MED ORDER — BENZONATATE 100 MG PO CAPS: 100.0000 mg | ORAL_CAPSULE | Freq: Three times a day (TID) | ORAL | 0 refills | Status: DC | PRN

## 2017-10-04 MED ORDER — CETIRIZINE HCL 10 MG PO TABS: 10.0000 mg | ORAL_TABLET | Freq: Every day | ORAL | 11 refills | Status: DC

## 2017-10-04 MED ORDER — PREDNISONE 20 MG PO TABS: ORAL_TABLET | ORAL | 0 refills | Status: DC

## 2017-10-04 MED ORDER — LORATADINE 10 MG PO TABS: 10.0000 mg | ORAL_TABLET | Freq: Every day | ORAL | 11 refills | Status: DC

## 2017-10-04 NOTE — ED Triage Notes (Addendum)
[facility], pt complains of productive cough, wheezing, and SOB x1 week, patient has been taking OTC medications  but has no relief

## 2017-10-04 NOTE — ED Provider Notes (Signed)
MRN: [id number] DOB: 04/08/1961  Subjective:   Margaret MuldersWatseka G Ramirez is a 57 y.o. female presenting for 1 week history of persistent productive cough that elicits back pain, shortness of breath and wheezing at night.  Patient has also had subjective fever, sinus congestion, bilateral ear fullness and sore throat.  She has been taking Tylenol.  Admits that she has a history of allergies but does not take anything for these.  Denies sinus pain, ear pain, ear drainage, chest pain, nausea, vomiting, belly pain.  Denies smoking cigarettes.  Denies history of asthma.  No current facility-administered medications for this encounter.   Current Outpatient Medications:  .  acetaminophen (TYLENOL) 325 MG tablet, Take 650 mg by mouth every 6 (six) hours as needed., Disp: , Rfl:  .  Multiple Vitamin (MULTIVITAMIN) tablet, Take 1 tablet by mouth daily. Reported on 11/19/2015, Disp: , Rfl:     Allergies  Allergen Reactions  . Cefzil [Cefprozil] Rash    On thighs and forarms.    Past Medical History:  Diagnosis Date  . Acute meniscal tear of left knee    s/p repair   . Arthritis   . Pemphigus vulgaris    Dr. Lovenia KimSteinhelfer  . Reflux esophagitis 12/2010   EGD with Dr. Loreta AveMann; grade 1 distal esophagitis and peptic-type duodenopathy on path  . Vitamin D deficiency   . Wears contact lenses      Past Surgical History:  Procedure Laterality Date  . CESAREAN SECTION  1992  . CHONDROPLASTY Left 03/14/2014   Procedure: CHONDROPLASTY;  Surgeon: Eugenia Mcalpineobert Collins, MD;  [facility] [place];  Service: Orthopedics;  Laterality: Left;  . COLONOSCOPY WITH ESOPHAGOGASTRODUODENOSCOPY (EGD)  01-31-2011  . KNEE ARTHROSCOPY Left 03/14/2014   Procedure: ARTHROSCOPY KNEE;  Surgeon: Eugenia Mcalpineobert Collins, MD;  [facility] [place];  Service: Orthopedics;  Laterality: Left;  . KNEE ARTHROSCOPY WITH MEDIAL MENISECTOMY Left 03/14/2014   Procedure: KNEE ARTHROSCOPY WITH MEDIAL MENISECTOMY;  Surgeon: Eugenia Mcalpineobert  Collins, MD;  [facility] [place];  Service: Orthopedics;  Laterality: Left;  . LAPARSCOPIC RIGHT OVARIAN CYSTECTOMY  11-21-1999    Objective:   Vitals: BP 115/74 (BP Location: Left Arm)   Pulse 60   Temp 97.6 F (36.4 C) (Oral)   Resp 17   LMP 11/19/2008   SpO2 96%   Physical Exam  Constitutional: She is oriented to person, place, and time. She appears well-developed and well-nourished.  HENT:  No maxillary sinus tenderness.  TMs opaque bilaterally but intact and without erythema.  Throat with postnasal drainage and cobblestone pattern.  Eyes: Right eye exhibits no discharge. Left eye exhibits no discharge.  Cardiovascular: Normal rate, regular rhythm and intact distal pulses. Exam reveals no gallop and no friction rub.  No murmur heard. Pulmonary/Chest: No respiratory distress. She has no wheezes. She has no rales.  Neurological: She is alert and oriented to person, place, and time.  Skin: Skin is warm and dry.  Psychiatric: She has a normal mood and affect.   Assessment and Plan :   Viral URI with cough  Environmental allergies  Patient is to start short steroid course and use albuterol inhaler 2-3 times a day.  Offered cough suppression medications.  Counseled patient on importance of maintaining her allergy treatment. Counseled patient on potential for adverse effects with medications prescribed today, patient verbalized understanding. Return-to-clinic precautions discussed, patient verbalized understanding.  Consider antibiotic course if patient has no relief over the next week.     Wallis BambergMani, Severa Jeremiah, PA-C  10/04/17 1251  

## 2017-10-04 NOTE — Discharge Instructions (Signed)
Hydrate well with at least 2 liters (1 gallon) of water daily. For sore throat try using a honey-based tea. Use 3 teaspoons of honey with juice squeezed from half lemon. Place shaved pieces of ginger into 1/2-1 cup of water and warm over stove top. Then mix the ingredients and repeat every 4 hours as needed.  °

## 2017-10-23 ENCOUNTER — Telehealth: Payer: Self-pay | Admitting: Family Medicine

## 2017-10-23 NOTE — Telephone Encounter (Signed)
Received requested mammogram from [facility]. Sending back for review.

## 2017-10-26 ENCOUNTER — Other Ambulatory Visit: Payer: Self-pay | Admitting: Urgent Care

## 2018-02-24 ENCOUNTER — Encounter: Payer: Self-pay | Admitting: Family Medicine

## 2018-08-04 ENCOUNTER — Other Ambulatory Visit: Payer: Self-pay

## 2018-08-04 ENCOUNTER — Encounter (HOSPITAL_COMMUNITY): Payer: Self-pay

## 2018-08-04 ENCOUNTER — Emergency Department (HOSPITAL_COMMUNITY)
Admission: EM | Admit: 2018-08-04 | Discharge: 2018-08-04 | Disposition: A | Discharge status: Home / Self Care | Payer: 59 | Attending: Emergency Medicine | Admitting: Emergency Medicine

## 2018-08-04 DIAGNOSIS — M545 Low back pain, unspecified: Secondary | ICD-10-CM

## 2018-08-04 DIAGNOSIS — Z79899 Other long term (current) drug therapy: Secondary | ICD-10-CM | POA: Insufficient documentation

## 2018-08-04 MED ORDER — CYCLOBENZAPRINE HCL 10 MG PO TABS: 10.0000 mg | ORAL_TABLET | Freq: Two times a day (BID) | ORAL | 0 refills | Status: DC | PRN

## 2018-08-04 MED ORDER — KETOROLAC TROMETHAMINE 30 MG/ML IJ SOLN
30.0000 mg | Freq: Once | INTRAMUSCULAR | Status: AC
  Administered 2018-08-04: 30 mg via INTRAVENOUS
  Filled 2018-08-04: qty 1

## 2018-08-04 MED ORDER — CYCLOBENZAPRINE HCL 10 MG PO TABS
10.0000 mg | ORAL_TABLET | Freq: Once | ORAL | Status: AC
Start: 2018-08-04 — End: 2018-08-04
  Administered 2018-08-04: 10 mg via ORAL
  Filled 2018-08-04: qty 1

## 2018-08-04 MED ORDER — LIDOCAINE 5 % EX PTCH
1.0000 | MEDICATED_PATCH | Freq: Once | CUTANEOUS | Status: DC
  Administered 2018-08-04: 1 via TRANSDERMAL
  Filled 2018-08-04: qty 1

## 2018-08-04 MED ORDER — METHYLPREDNISOLONE 4 MG PO TBPK: ORAL_TABLET | ORAL | 0 refills | Status: DC

## 2018-08-04 MED ORDER — FENTANYL CITRATE (PF) 100 MCG/2ML IJ SOLN
100.0000 ug | Freq: Once | INTRAMUSCULAR | Status: AC
  Administered 2018-08-04: 100 ug via INTRAVENOUS
  Filled 2018-08-04: qty 2

## 2018-08-04 MED ORDER — MORPHINE SULFATE (PF) 4 MG/ML IV SOLN
4.0000 mg | Freq: Once | INTRAVENOUS | Status: AC
  Administered 2018-08-04: 4 mg via INTRAVENOUS
  Filled 2018-08-04: qty 1

## 2018-08-04 MED ORDER — HYDROCODONE-ACETAMINOPHEN 5-325 MG PO TABS: 1.0000 | ORAL_TABLET | Freq: Four times a day (QID) | ORAL | 0 refills | Status: DC | PRN

## 2018-08-04 NOTE — ED Notes (Signed)
Bed: WA15 Expected date:  Expected time:  Means of arrival:  Comments: EMS-back pain 

## 2018-08-04 NOTE — Discharge Instructions (Signed)
1. Medications: Take steroid taper as prescribed with food to avoid upset stomach issues.  Do not take ibuprofen, Advil, Aleve, or Motrin while taking this medicine.  You may take [phone] mg of Tylenol every 6 hours as needed for pain. Do not exceed 4000 mg of Tylenol daily.  You can take Flexeril as needed for muscle relaxation but this medication may make you drowsy so do not drive, drink alcohol, operate heavy machinery, or make important decisions while you are using this medicine.  The same goes for the hydrocodone.  Please be aware that the hydrocodone contains Tylenol as well, so make sure you are not taking too much Tylenol.  Hydrocodone can also cause constipation so I recommend taking a stool softener with this if you do have the side effect.  I typically recommend only taking this medicine at night.  You can also cut these tablets in half if they are very strong. 2. Treatment: rest, apply ice or heat (whichever feels best) 20 minutes at a time 3 or 4 times daily, drink plenty of fluids, gentle stretching (see attached exercises, but you can also YouTube search lumbar physical therapy exercises).   3. Follow Up: Please followup with orthopedics as directed or your PCP in 1 week if no improvement for discussion of your diagnoses and further evaluation after today's visit; if you do not have a primary care doctor use the resource guide provided to find one; Please return to the ER for worsening symptoms or other concerns such as fevers, loss of pulses, bowel or bladder incontinence, weakness, or loss of feeling

## 2018-08-04 NOTE — ED Notes (Addendum)
Physical Therapy at bedside.

## 2018-08-04 NOTE — ED Provider Notes (Signed)
[facility]-EMERGENCY DEPT Provider Note   CSN: [id number] Arrival date & time: 08/04/18  1232    History   Chief Complaint Chief Complaint  Patient presents with  . Back Pain    HPI Margaret Ramirez is a 58 y.o. female with history of pemphigus vulgaris, arthritis presents for evaluation of acute onset, persistent low back pain beginning earlier today.  She reports that she has had history of back pain intermittently for 20 years and typically goes to her chiropractor when she has a flareup.  She reports that when she awoke this morning she felt some tightness in her low back so she made an appointment with her chiropractor.  She states that at around 930 she was undergoing some chiropractic therapy including ultrasound and manual massage but no adjustments.  She states that she was then unable to get up off of the bed.  She states that her chiropractor tried cold therapy which was not helpful and blocks to change her positioning which was somewhat helpful.  She received 100 mcg of fentanyl via EMS which "took the edge off ".  She reports that pain is constant, sharp, localized to the entire low back, worsens with any attempts to change positions or ambulate.  She feels somewhat comfortable laying flat.  The pain does not radiate down the extremities.  No abdominal pain.  She denies bowel or bladder incontinence, saddle anesthesia, fevers, IV drug use, or history of cancer.     The history is provided by the patient.    Past Medical History:  Diagnosis Date  . Acute meniscal tear of left knee    s/p repair   . Arthritis   . Pemphigus vulgaris    Dr. Lovenia KimSteinhelfer  . Reflux esophagitis 12/2010   EGD with Dr. Loreta AveMann; grade 1 distal esophagitis and peptic-type duodenopathy on path  . Vitamin D deficiency   . Wears contact lenses     Patient Active Problem List   Diagnosis Date Noted  . Obesity (BMI 30.0-34.9) 06/27/2015  . Pemphigus vulgaris 06/27/2015  . S/P left  knee arthroscopy 03/14/2014  . Menopausal symptom 07/26/2012  . Esophagitis 02/03/2011    Past Surgical History:  Procedure Laterality Date  . CESAREAN SECTION  1992  . CHONDROPLASTY Left 03/14/2014   Procedure: CHONDROPLASTY;  Surgeon: Eugenia Mcalpineobert Collins, MD;  [facility] [place];  Service: Orthopedics;  Laterality: Left;  . COLONOSCOPY WITH ESOPHAGOGASTRODUODENOSCOPY (EGD)  01-31-2011  . KNEE ARTHROSCOPY Left 03/14/2014   Procedure: ARTHROSCOPY KNEE;  Surgeon: Eugenia Mcalpineobert Collins, MD;  [facility] [place];  Service: Orthopedics;  Laterality: Left;  . KNEE ARTHROSCOPY WITH MEDIAL MENISECTOMY Left 03/14/2014   Procedure: KNEE ARTHROSCOPY WITH MEDIAL MENISECTOMY;  Surgeon: Eugenia Mcalpineobert Collins, MD;  [facility] [place];  Service: Orthopedics;  Laterality: Left;  . LAPARSCOPIC RIGHT OVARIAN CYSTECTOMY  11-21-1999     OB History    Gravida  2   Para  2   Term      Preterm      AB      Living  2     SAB      TAB      Ectopic      Multiple      Live Births  2            Home Medications    Prior to Admission medications   Medication Sig Start Date End Date Taking? Authorizing Provider  acetaminophen (TYLENOL) 325 MG tablet  Take 650 mg by mouth every 6 (six) hours as needed.    [provider]  albuterol (PROVENTIL HFA;VENTOLIN HFA) 108 (90 Base) MCG/ACT inhaler Inhale 1-2 puffs into the lungs every 6 (six) hours as needed for wheezing or shortness of breath. 10/04/17   Wallis BambergMani, Mario, PA-C  benzonatate (TESSALON) 100 MG capsule Take 1-2 capsules (100-200 mg total) by mouth 3 (three) times daily as needed. 10/04/17   Wallis BambergMani, Mario, PA-C  cetirizine (ZYRTEC ALLERGY) 10 MG tablet Take 1 tablet (10 mg total) by mouth daily. 10/04/17   Wallis BambergMani, Mario, PA-C  cyclobenzaprine (FLEXERIL) 10 MG tablet Take 1 tablet (10 mg total) by mouth 2 (two) times daily as needed for muscle spasms. 08/04/18   Basha Krygier A, PA-C  HYDROcodone-acetaminophen  (NORCO/VICODIN) 5-325 MG tablet Take 1 tablet by mouth every 6 (six) hours as needed for severe pain. 08/04/18   Tamaria Dunleavy A, PA-C  HYDROcodone-homatropine (HYCODAN) 5-1.5 MG/5ML syrup Take 5 mLs by mouth at bedtime as needed. 10/04/17   Wallis BambergMani, Mario, PA-C  loratadine (CLARITIN) 10 MG tablet Take 1 tablet (10 mg total) by mouth daily. 10/04/17   Wallis BambergMani, Mario, PA-C  methylPREDNISolone (MEDROL DOSEPAK) 4 MG TBPK tablet Day 1: 8 mg PO before breakfast, 4 mg after lunch and after dinner, and 8 mg at bedtime Day 2: 4 mg PO before breakfast, after lunch, and after dinner and 8 mg at bedtime Day 3: 4 mg PO before breakfast, after lunch, after dinner, and at bedtime Day 4: 4 mg PO before breakfast, after lunch, and at bedtime Day 5: 4 mg PO before breakfast and at bedtime Day 6: 4 mg PO before breakfast 08/04/18   Emmry Hinsch A, PA-C  Multiple Vitamin (MULTIVITAMIN) tablet Take 1 tablet by mouth daily. Reported on 11/19/2015    [provider]  predniSONE (DELTASONE) 20 MG tablet Take 2 tablets daily with breakfast. 10/04/17   Wallis BambergMani, Mario, PA-C    Family History Family History  Problem Relation Age of Onset  . Cancer Mother        lung  . Asthma Mother   . COPD Father        Emphysema  . Heart disease Father   . Hyperlipidemia Sister   . Hyperlipidemia Brother   . Hypertension Brother   . COPD Paternal Grandfather        Black lung  . Diabetes Neg Hx     Social History Social History   Tobacco Use  . Smoking status: Never Smoker  . Smokeless tobacco: Never Used  Substance Use Topics  . Alcohol use: No    Alcohol/week: 0.0 standard drinks  . Drug use: No     Allergies   Cefzil [cefprozil]   Review of Systems Review of Systems  Constitutional: Negative for chills and fever.  Musculoskeletal: Positive for back pain.  Neurological: Negative for weakness and numbness.  All other systems reviewed and are negative.    Physical Exam Updated Vital Signs BP 120/79 (BP Location:  Left Arm)   Pulse (!) 51   Temp 97.9 F (36.6 C) (Oral)   Resp 18   Ht 5\' 9"  (1.753 m)   Wt 99.8 kg   LMP 11/19/2008   SpO2 98%   BMI 32.49 kg/m   Physical Exam Vitals signs and nursing note reviewed.  Constitutional:      General: She is not in acute distress.    Appearance: She is well-developed.     Comments: Laying supine in bed,  appears uncomfortable  HENT:     Head: Normocephalic and atraumatic.  Eyes:     General:        Right eye: No discharge.        Left eye: No discharge.     Conjunctiva/sclera: Conjunctivae normal.  Neck:     Musculoskeletal: Normal range of motion and neck supple.     Vascular: No JVD.     Trachea: No tracheal deviation.  Cardiovascular:     Rate and Rhythm: Normal rate.     Pulses: Normal pulses.     Comments: 2+ radial and DP/PT pulses bilaterally, Homans sign absent bilaterally, no lower extremity edema, no palpable cords, compartments are soft  Pulmonary:     Effort: Pulmonary effort is normal.  Abdominal:     General: There is no distension.     Tenderness: There is no abdominal tenderness. There is no guarding or rebound.  Musculoskeletal:     Comments: No midline lumbar spine tenderness.  Right greater than left paralumbar muscle tenderness.  No deformity, crepitus, or step-off noted.  Right SI joint tenderness noted.  5/5 strength of BLE major muscle groups.  Negative straight leg raise bilaterally.  Pain elicited with flexion of the right hip.  Skin:    General: Skin is warm and dry.     Capillary Refill: Capillary refill takes less than 2 seconds.     Findings: No erythema.  Neurological:     General: No focal deficit present.     Mental Status: She is alert.     Comments: Fluent speech with no evidence of dysarthria or aphasia, no facial droop, sensation intact to soft touch of bilateral lower extremities.  Unable to assess gait at this time secondary to pain.  Psychiatric:        Behavior: Behavior normal.      ED  Treatments / Results  Labs (all labs ordered are listed, but only abnormal results are displayed) Labs Reviewed - No data to display  EKG None  Radiology No results found.  Procedures Procedures (including critical care time)  Medications Ordered in ED Medications  lidocaine (LIDODERM) 5 % 1 patch (1 patch Transdermal Patch Applied 08/04/18 1602)  cyclobenzaprine (FLEXERIL) tablet 10 mg (10 mg Oral Given 08/04/18 1352)  ketorolac (TORADOL) 30 MG/ML injection 30 mg (30 mg Intravenous Given 08/04/18 1402)  fentaNYL (SUBLIMAZE) injection 100 mcg (100 mcg Intravenous Given 08/04/18 1402)  morphine 4 MG/ML injection 4 mg (4 mg Intravenous Given 08/04/18 1630)     Initial Impression / Assessment and Plan / ED Course  I have reviewed the triage vital signs and the nursing notes.  Pertinent labs & imaging results that were available during my care of the patient were reviewed by me and considered in my medical decision making (see chart for details).        Patient presenting for evaluation of acute onset of low back pain which worsened after seeing her chiropractor just prior to arrival.  She is afebrile, vital signs are stable.  She is nontoxic in appearance.  She is neurovascularly intact.  Has significant pain with any flexion of the lumbar spine as well as engagement of the hip flexors.  No red flag signs concerning for cauda equina or spinal abscess.  Her pain was managed in the ED and on reassessment she was able to pull herself up to a seated position.  Physical therapy evaluation was requested and recommended outpatient physical therapy for the next few  weeks which her PCP can order.  She was able to ambulate with the aid of a walker in the ED with steady gait and balance.  No midline spine tenderness, no concern for traumatic spine injury, spinal abscess, dissection.  Will discharge with steroid taper, Flexeril for muscle relaxation, and hydrocodone for severe breakthrough pain.   Advised of side effects and that Flexeril and hydrocodone should not be taken if she is going to drive, drink alcohol, operate heavy machinery, or make important decisions.  Walker ordered for home use.  Recommend follow-up with PCP or her orthopedist for reevaluation of symptoms.  Discussed strict ED return precautions.  Patient and daughter verbalized understanding of and agreement with plan and patient stable for discharge home at this time.  Final Clinical Impressions(s) / ED Diagnoses   Final diagnoses:  Acute bilateral low back pain without sciatica    ED Discharge Orders         Ordered    For home use only DME Walker     08/04/18 1811    cyclobenzaprine (FLEXERIL) 10 MG tablet  2 times daily PRN     08/04/18 1817    methylPREDNISolone (MEDROL DOSEPAK) 4 MG TBPK tablet     08/04/18 1817    HYDROcodone-acetaminophen (NORCO/VICODIN) 5-325 MG tablet  Every 6 hours PRN     08/04/18 1818           Jeanie Sewer, PA-C 08/04/18 1856    Geoffery Lyons, MD 08/05/18 [phone]

## 2018-08-04 NOTE — Evaluation (Signed)
Physical Therapy One-Time Evaluation Patient Details Name: Margaret Ramirez MRN: [id number] DOB: 1961/02/13 Today's Date: 08/04/2018   History of Present Illness  57 yo admitted to ED on 2/19 with acute on chronic LBP. PMH includes OA, obesity, L knee arthroscopy.   Clinical Impression  Pt presents with acute on chronic LBP, LE weakness noticed functionally, difficulty performing bed mobility/transfers/gait due to back pain, and decreased activity tolerance. Pt to benefit from acute PT to address deficits. Pt ambulated 80 ft with RW with supervision assist, pt unable to walk without RW at this time due to instability and back pain. Pt walks with very forward flexed posture due to pt comfort, + shopping cart sign. Pt very intolerant of spinal extension. Pt's PA informed of pt presentation, and PT recommended OPPT to address deficits. Pt to d/c from ED with daughter.     Follow Up Recommendations Outpatient PT;Supervision for mobility/OOB    Equipment Recommendations  Rolling walker with 5" wheels    Recommendations for Other Services       Precautions / Restrictions Precautions Precautions: Fall Restrictions Weight Bearing Restrictions: No Other Position/Activity Restrictions: WBAT       Mobility  Bed Mobility Overal bed mobility: Needs Assistance Bed Mobility: Supine to Sit;Sit to Supine     Supine to sit: Min guard;HOB elevated Sit to supine: Min assist;HOB elevated   General bed mobility comments: Min guard for safety when moving from supine to sit, increased time. Min assist for bringing bilateral LEs into bed when transitioning from sit to supine.   Transfers Overall transfer level: Needs assistance Equipment used: Rolling walker (2 wheeled) Transfers: Sit to/from Stand Sit to Stand: Min guard         General transfer comment: Min guard for safety, verbal cuing for pushing off of bed with UEs to stand. Once standing, pt defaulted to slightly forward flexed position  of comfort.   Ambulation/Gait Ambulation/Gait assistance: Supervision;+2 safety/equipment Gait Distance (Feet): 75 Feet Assistive device: Rolling walker (2 wheeled) Gait Pattern/deviations: Step-through pattern;Shuffle;Decreased stride length;Trunk flexed Gait velocity: decr    General Gait Details: Supervision for safety. verbal cuing for staying inside parameters of RW for stability, especially when turning. PT attempted to have pt ambulate without RW, but pt unstable and reaching for environment for steadying.   Stairs Stairs: (Pt defers stair training, states she will have assist from husband or can walk in level entry )          Wheelchair Mobility    Modified Rankin (Stroke Patients Only)       Balance Overall balance assessment: Needs assistance Sitting-balance support: No upper extremity supported Sitting balance-Leahy Scale: Good     Standing balance support: Bilateral upper extremity supported Standing balance-Leahy Scale: Fair Standing balance comment: able to stand without UE support, limited by back pain                              Pertinent Vitals/Pain Pain Assessment: Faces Faces Pain Scale: Hurts even more Pain Location: back, at level of L4-L5 based on pt's pointing  Pain Descriptors / Indicators: Sore Pain Intervention(s): Limited activity within patient's tolerance;Repositioned;Ice applied;Monitored during session    [place] expects to be discharged to:: Private residence Living Arrangements: Spouse/significant other Available Help at Discharge: Family;Available PRN/intermittently Type of Home: House Home Access: Stairs to enter;Level entry   Entrance Stairs-Number of Steps: 6, or none if going through the back door  Home Layout: Able to live on main level with bedroom/bathroom Home Equipment: None      Prior Function Level of Independence: Independent         Comments: Pt reports her back pain comes and  goes, but she typically walks in a forward flexed posture. Pt reports she leans on shopping carts at the grocery store for back comfort. Pt goes to chiropractor PRN for back pain, and went today prior to admission. Pt states that today, chiropractor did STM and other techniques to spine in prone position with no manipulations, as well as prone spinal extensions. Afterwards, pt could not ambulate.      Hand Dominance        Extremity/Trunk Assessment   Upper Extremity Assessment Upper Extremity Assessment: Overall WFL for tasks assessed    Lower Extremity Assessment Lower Extremity Assessment: Generalized weakness    Cervical / Trunk Assessment Cervical / Trunk Assessment: Other exceptions Cervical / Trunk Exceptions: Pt standing in forward flexed posture due to back pain, flexible but increased pain with neutral spine extension   Communication   Communication: No difficulties  Cognition Arousal/Alertness: Awake/alert Behavior During Therapy: WFL for tasks assessed/performed Overall Cognitive Status: Within Functional Limits for tasks assessed                                        General Comments General comments (skin integrity, edema, etc.): unable to reproduce pt's concordant pain with palpation, Pt's paraspinals on R and L not TTP. Pt localizing pain as at L4-L5 (level that pt was pointing to)    Exercises Other Exercises Other Exercises: flexion of spine x10 in sitting and standing, improved back pain with flexion based activity Other Exercises: Extension of spine in sitting and standing exacerbated pt's pain, not performed as intervention Other Exercises: Pt educated on resting, icing, and positions of comfort (sidelying with pillow between legs and hugging a pillow) to decrease acute flareup symptoms   Assessment/Plan    PT Assessment All further PT needs can be met in the next venue of care  PT Problem List Decreased strength;Pain;Decreased range of  motion;Decreased activity tolerance;Decreased mobility;Decreased balance       PT Treatment Interventions      PT Goals (Current goals can be found in the Care Plan section)  Acute Rehab PT Goals Patient Stated Goal: decrease back pain  PT Goal Formulation: With patient Time For Goal Achievement: 08/04/18 Potential to Achieve Goals: Good    Frequency     Barriers to discharge        Co-evaluation               AM-PAC PT "6 Clicks" Mobility  Outcome Measure Help needed turning from your back to your side while in a flat bed without using bedrails?: A Little Help needed moving from lying on your back to sitting on the side of a flat bed without using bedrails?: A Little Help needed moving to and from a bed to a chair (including a wheelchair)?: A Little Help needed standing up from a chair using your arms (e.g., wheelchair or bedside chair)?: A Little Help needed to walk in hospital room?: A Little Help needed climbing 3-5 steps with a railing? : A Little 6 Click Score: 18    End of Session   Activity Tolerance: Patient limited by pain Patient left: in bed;with call bell/phone within reach;with family/visitor  present Nurse Communication: Mobility status PT Visit Diagnosis: Pain Pain - Right/Left: Right Pain - part of body: (back )    Time: 7322-5672 PT Time Calculation (min) (ACUTE ONLY): 27 min   Charges:   PT Evaluation $PT Eval Low Complexity: 1 Low PT Treatments $Gait Training: 8-22 mins       Julien Girt, PT Acute Rehabilitation Services Pager [phone]  Office [phone]  Margaret Ramirez 08/04/2018, 7:14 PM

## 2018-08-04 NOTE — ED Triage Notes (Signed)
Pt coming via [facility] complaining of chronic lower back pain. Pt is normally seen by chiropractors office and was there today to electrical therapy, after 2 hours of therapy, pt was unable to move or ambulate. Pt is normally an ambulatory patient. fentanyl given by EMS PTA.

## 2018-08-11 ENCOUNTER — Encounter: Payer: Self-pay | Admitting: Family Medicine

## 2018-08-16 DIAGNOSIS — M5136 Other intervertebral disc degeneration, lumbar region: Secondary | ICD-10-CM | POA: Insufficient documentation

## 2018-08-16 DIAGNOSIS — M51369 Other intervertebral disc degeneration, lumbar region without mention of lumbar back pain or lower extremity pain: Secondary | ICD-10-CM | POA: Insufficient documentation

## 2018-08-16 DIAGNOSIS — M4316 Spondylolisthesis, lumbar region: Secondary | ICD-10-CM | POA: Insufficient documentation

## 2018-08-16 DIAGNOSIS — M545 Low back pain, unspecified: Secondary | ICD-10-CM | POA: Insufficient documentation

## 2019-04-11 ENCOUNTER — Encounter: Payer: Self-pay | Admitting: Family Medicine

## 2019-04-19 DIAGNOSIS — M159 Polyosteoarthritis, unspecified: Secondary | ICD-10-CM | POA: Insufficient documentation

## 2019-04-19 DIAGNOSIS — M1811 Unilateral primary osteoarthritis of first carpometacarpal joint, right hand: Secondary | ICD-10-CM | POA: Insufficient documentation

## 2019-04-19 DIAGNOSIS — M25531 Pain in right wrist: Secondary | ICD-10-CM | POA: Insufficient documentation

## 2019-04-19 DIAGNOSIS — M19041 Primary osteoarthritis, right hand: Secondary | ICD-10-CM | POA: Insufficient documentation

## 2019-06-30 ENCOUNTER — Encounter: Payer: Self-pay | Admitting: Family Medicine

## 2019-08-31 ENCOUNTER — Encounter: Payer: Self-pay | Admitting: Family Medicine

## 2019-10-13 ENCOUNTER — Encounter: Payer: Self-pay | Admitting: Family Medicine

## 2019-10-19 ENCOUNTER — Encounter: Payer: Commercial Managed Care - HMO | Admitting: Family Medicine

## 2019-12-06 DIAGNOSIS — M7050 Other bursitis of knee, unspecified knee: Secondary | ICD-10-CM | POA: Insufficient documentation

## 2020-04-02 ENCOUNTER — Encounter: Payer: Commercial Managed Care - HMO | Admitting: Family Medicine

## 2020-06-04 ENCOUNTER — Encounter: Payer: Self-pay | Admitting: *Deleted

## 2020-08-20 ENCOUNTER — Encounter: Payer: Commercial Managed Care - HMO | Admitting: Family Medicine

## 2020-08-20 ENCOUNTER — Telehealth: Payer: Self-pay | Admitting: *Deleted

## 2020-08-20 ENCOUNTER — Encounter: Payer: Self-pay | Admitting: Family Medicine

## 2020-08-20 NOTE — Telephone Encounter (Signed)
Dismissal letter sent.

## 2020-08-20 NOTE — Telephone Encounter (Signed)
I forgot to send you this cancellation from last week.

## 2020-08-20 NOTE — Telephone Encounter (Signed)
This patient needs to be discharged.  Look at how many times she has cancelled her physicals.  She hasn't been seen here in years. She is taking away slots from other people.  (I see her husband, and wish to continue to, but haven't seen her in a very long time, as you can see)

## 2020-10-11 DIAGNOSIS — M25561 Pain in right knee: Secondary | ICD-10-CM | POA: Insufficient documentation

## 2020-12-05 LAB — HM COLONOSCOPY

## 2021-01-22 ENCOUNTER — Ambulatory Visit (HOSPITAL_COMMUNITY)
Admission: EM | Admit: 2021-01-22 | Discharge: 2021-01-22 | Disposition: A | Discharge status: Home / Self Care | Payer: 59 | Attending: Student | Admitting: Student

## 2021-01-22 ENCOUNTER — Ambulatory Visit (INDEPENDENT_AMBULATORY_CARE_PROVIDER_SITE_OTHER): Payer: 59

## 2021-01-22 ENCOUNTER — Encounter (HOSPITAL_COMMUNITY): Payer: Self-pay | Admitting: Emergency Medicine

## 2021-01-22 ENCOUNTER — Other Ambulatory Visit: Payer: Self-pay

## 2021-01-22 DIAGNOSIS — R059 Cough, unspecified: Secondary | ICD-10-CM | POA: Diagnosis not present

## 2021-01-22 DIAGNOSIS — J209 Acute bronchitis, unspecified: Secondary | ICD-10-CM | POA: Diagnosis not present

## 2021-01-22 MED ORDER — ALBUTEROL SULFATE HFA 108 (90 BASE) MCG/ACT IN AERS: 1.0000 | INHALATION_SPRAY | Freq: Four times a day (QID) | RESPIRATORY_TRACT | 0 refills | Status: DC | PRN

## 2021-01-22 MED ORDER — PROMETHAZINE-DM 6.25-15 MG/5ML PO SYRP: 5.0000 mL | ORAL_SOLUTION | Freq: Four times a day (QID) | ORAL | 0 refills | Status: DC | PRN

## 2021-01-22 MED ORDER — BENZONATATE 100 MG PO CAPS: 100.0000 mg | ORAL_CAPSULE | Freq: Three times a day (TID) | ORAL | 0 refills | Status: DC | PRN

## 2021-01-22 MED ORDER — PREDNISONE 10 MG (21) PO TBPK: ORAL_TABLET | Freq: Every day | ORAL | 0 refills | Status: DC

## 2021-01-22 NOTE — ED Provider Notes (Signed)
[facility]    CSN: [id number] Arrival date & time: 01/22/21  1511      History   Chief Complaint Chief Complaint  Patient presents with   Cough    HPI Margaret Ramirez is a 60 y.o. female presenting with productive cough x7 days. Medical history noncontributory, denies history pulmonary disease. Initially with sore throat and subjective chills, which have resolved. Now with hacking cough productive of green sputum. Shortness of breath with coughing and lying flat. OTC Delsym, Tylenol cold and flu providing minimal relief. Multiple family members are also sick, everybody has had a negative covid test so far. Pt has had negative covid test. Denies current sore throat, fevers/chills, chest pain, dizziness.   HPI  Past Medical History:  Diagnosis Date   Acute meniscal tear of left knee    s/p repair    Arthritis    Pemphigus vulgaris    Dr. Lovenia Kim   Reflux esophagitis 12/2010   EGD with Dr. Loreta Ave; grade 1 distal esophagitis and peptic-type duodenopathy on path   Vitamin D deficiency    Wears contact lenses     Patient Active Problem List   Diagnosis Date Noted   Obesity (BMI 30.0-34.9) 06/27/2015   Pemphigus vulgaris 06/27/2015   S/P left knee arthroscopy 03/14/2014   Menopausal symptom 07/26/2012   Esophagitis 02/03/2011    Past Surgical History:  Procedure Laterality Date   CESAREAN SECTION  1992   CHONDROPLASTY Left 03/14/2014   Procedure: CHONDROPLASTY;  Surgeon: Eugenia Mcalpine, MD;  Location: [facility];  Service: Orthopedics;  Laterality: Left;   COLONOSCOPY WITH ESOPHAGOGASTRODUODENOSCOPY (EGD)  01-31-2011   KNEE ARTHROSCOPY Left 03/14/2014   Procedure: ARTHROSCOPY KNEE;  Surgeon: Eugenia Mcalpine, MD;  Location: [facility];  Service: Orthopedics;  Laterality: Left;   KNEE ARTHROSCOPY WITH MEDIAL MENISECTOMY Left 03/14/2014   Procedure: KNEE ARTHROSCOPY WITH MEDIAL MENISECTOMY;  Surgeon: Eugenia Mcalpine, MD;  Location:  [facility];  Service: Orthopedics;  Laterality: Left;   LAPARSCOPIC RIGHT OVARIAN CYSTECTOMY  11-21-1999    OB History     Gravida  2   Para  2   Term      Preterm      AB      Living  2      SAB      IAB      Ectopic      Multiple      Live Births  2            Home Medications    Prior to Admission medications   Medication Sig Start Date End Date Taking? Authorizing Provider  predniSONE (STERAPRED UNI-PAK 21 TAB) 10 MG (21) TBPK tablet Take by mouth daily. Take 6 tabs by mouth daily  for 2 days, then 5 tabs for 2 days, then 4 tabs for 2 days, then 3 tabs for 2 days, 2 tabs for 2 days, then 1 tab by mouth daily for 2 days 01/22/21  Yes Rhys Martini, PA-C  promethazine-dextromethorphan (PROMETHAZINE-DM) 6.25-15 MG/5ML syrup Take 5 mLs by mouth 4 (four) times daily as needed for cough. 01/22/21  Yes Rhys Martini, PA-C  acetaminophen (TYLENOL) 325 MG tablet Take 650 mg by mouth every 6 (six) hours as needed.    [provider]  albuterol (VENTOLIN HFA) 108 (90 Base) MCG/ACT inhaler Inhale 1-2 puffs into the lungs every 6 (six) hours as needed for wheezing or shortness of breath. 01/22/21   Cheree Ditto,  Lyman Speller, PA-C  benzonatate (TESSALON) 100 MG capsule Take 1-2 capsules (100-200 mg total) by mouth 3 (three) times daily as needed. 01/22/21   Rhys Martini, PA-C  cetirizine (ZYRTEC ALLERGY) 10 MG tablet Take 1 tablet (10 mg total) by mouth daily. 10/04/17   Wallis Bamberg, PA-C  cyclobenzaprine (FLEXERIL) 10 MG tablet Take 1 tablet (10 mg total) by mouth 2 (two) times daily as needed for muscle spasms. 08/04/18   Fawze, Mina A, PA-C  HYDROcodone-acetaminophen (NORCO/VICODIN) 5-325 MG tablet Take 1 tablet by mouth every 6 (six) hours as needed for severe pain. 08/04/18   Fawze, Mina A, PA-C  HYDROcodone-homatropine (HYCODAN) 5-1.5 MG/5ML syrup Take 5 mLs by mouth at bedtime as needed. 10/04/17   Wallis Bamberg, PA-C  loratadine (CLARITIN) 10 MG tablet Take 1  tablet (10 mg total) by mouth daily. 10/04/17   Wallis Bamberg, PA-C  methylPREDNISolone (MEDROL DOSEPAK) 4 MG TBPK tablet Day 1: 8 mg PO before breakfast, 4 mg after lunch and after dinner, and 8 mg at bedtime Day 2: 4 mg PO before breakfast, after lunch, and after dinner and 8 mg at bedtime Day 3: 4 mg PO before breakfast, after lunch, after dinner, and at bedtime Day 4: 4 mg PO before breakfast, after lunch, and at bedtime Day 5: 4 mg PO before breakfast and at bedtime Day 6: 4 mg PO before breakfast 08/04/18   Fawze, Mina A, PA-C  Multiple Vitamin (MULTIVITAMIN) tablet Take 1 tablet by mouth daily. Reported on 11/19/2015    [provider]    Family History Family History  Problem Relation Age of Onset   Cancer Mother        lung   Asthma Mother    COPD Father        Emphysema   Heart disease Father    Hyperlipidemia Sister    Hyperlipidemia Brother    Hypertension Brother    COPD Paternal Grandfather        Black lung   Diabetes Neg Hx     Social History Social History   Tobacco Use   Smoking status: Never   Smokeless tobacco: Never  Substance Use Topics   Alcohol use: No    Alcohol/week: 0.0 standard drinks   Drug use: No     Allergies   Cefzil [cefprozil]   Review of Systems Review of Systems  Constitutional:  Negative for appetite change, chills and fever.  HENT:  Positive for congestion. Negative for ear pain, rhinorrhea, sinus pressure, sinus pain and sore throat.   Eyes:  Negative for redness and visual disturbance.  Respiratory:  Positive for cough and shortness of breath. Negative for chest tightness and wheezing.   Cardiovascular:  Negative for chest pain and palpitations.  Gastrointestinal:  Negative for abdominal pain, constipation, diarrhea, nausea and vomiting.  Genitourinary:  Negative for dysuria, frequency and urgency.  Musculoskeletal:  Negative for myalgias.  Neurological:  Negative for dizziness, weakness and headaches.   Psychiatric/Behavioral:  Negative for confusion.   All other systems reviewed and are negative.   Physical Exam Triage Vital Signs ED Triage Vitals  Enc Vitals Group     BP 01/22/21 1529 (!) 150/79     Pulse Rate 01/22/21 1529 68     Resp 01/22/21 1529 18     Temp 01/22/21 1529 98.3 F (36.8 C)     Temp Source 01/22/21 1529 Oral     SpO2 01/22/21 1529 98 %     Weight --  Height --      Head Circumference --      Peak Flow --      Pain Score 01/22/21 1528 0     Pain Loc --      Pain Edu? --      Excl. in GC? --    No data found.  Updated Vital Signs BP (!) 150/79 (BP Location: Right Wrist)   Pulse 68   Temp 98.3 F (36.8 C) (Oral)   Resp 18   LMP 11/19/2008   SpO2 98%   Visual Acuity Right Eye Distance:   Left Eye Distance:   Bilateral Distance:    Right Eye Near:   Left Eye Near:    Bilateral Near:     Physical Exam Vitals reviewed.  Constitutional:      General: She is not in acute distress.    Appearance: Normal appearance. She is not ill-appearing.  HENT:     Head: Normocephalic and atraumatic.     Right Ear: Tympanic membrane, ear canal and external ear normal. No tenderness. No middle ear effusion. There is no impacted cerumen. Tympanic membrane is not perforated, erythematous, retracted or bulging.     Left Ear: Tympanic membrane, ear canal and external ear normal. No tenderness.  No middle ear effusion. There is no impacted cerumen. Tympanic membrane is not perforated, erythematous, retracted or bulging.     Nose: Nose normal. No congestion.     Mouth/Throat:     Mouth: Mucous membranes are moist.     Pharynx: Uvula midline. No oropharyngeal exudate or posterior oropharyngeal erythema.  Eyes:     Extraocular Movements: Extraocular movements intact.     Pupils: Pupils are equal, round, and reactive to light.  Cardiovascular:     Rate and Rhythm: Normal rate and regular rhythm.     Heart sounds: Normal heart sounds.  Pulmonary:     Effort:  Pulmonary effort is normal. No tachypnea, bradypnea, accessory muscle usage or prolonged expiration.     Breath sounds: Examination of the left-upper field reveals rhonchi. Examination of the left-middle field reveals rhonchi. Rhonchi present. No decreased breath sounds, wheezing or rales.     Comments: Rhonchi and wheezes throughout, L>R. Abdominal:     Palpations: Abdomen is soft.     Tenderness: There is no abdominal tenderness. There is no guarding or rebound.  Neurological:     General: No focal deficit present.     Mental Status: She is alert and oriented to person, place, and time.  Psychiatric:        Mood and Affect: Mood normal.        Behavior: Behavior normal.        Thought Content: Thought content normal.        Judgment: Judgment normal.     UC Treatments / Results  Labs (all labs ordered are listed, but only abnormal results are displayed) Labs Reviewed - No data to display   EKG   Radiology DG Chest 2 View  Result Date: 01/22/2021 CLINICAL DATA:  1 week history of cough EXAM: CHEST - 2 VIEW COMPARISON:  None. FINDINGS: The lungs are clear without focal pneumonia, edema, pneumothorax or pleural effusion. Streaky opacity at the left base suggest atelectasis. The cardiopericardial silhouette is within normal limits for size. The visualized bony structures of the thorax show no acute abnormality. IMPRESSION: No active cardiopulmonary disease. Electronically Signed   By: Kennith Center M.D.   On: 01/22/2021 16:13    Procedures  Procedures (including critical care time)  Medications Ordered in UC Medications - No data to display  Initial Impression / Assessment and Plan / UC Course  I have reviewed the triage vital signs and the nursing notes.  Pertinent labs & imaging results that were available during my care of the patient were reviewed by me and considered in my medical decision making (see chart for details).     This patient is a very pleasant 60 y.o. year  old female presenting with acute bronchitis following viral URI. Afebrile, nontachy. Rhonchi and wheezing, oxygenating well on room air, denies history pulmonary ds. Negative home covid test.   CXR- no active cardiopulmonary disease.   Prednisone taper, promethazine DM, albuterol, tessalon.   ED return precautions discussed. Patient verbalizes understanding and agreement.   Level 4 for acute illness with systemic symptoms and prescription drug management.  Final Clinical Impressions(s) / UC Diagnoses   Final diagnoses:  Acute bronchitis, unspecified organism     Discharge Instructions      -Prednisone taper for cough/bronchitis. I recommend taking this in the morning as it could give you energy.  Avoid NSAIDs like ibuprofen and alleve while taking this medication as they can increase your risk of stomach upset and even GI bleeding when in combination with a steroid. You can continue tylenol (acetaminophen) up to 1000mg  3x daily. -Promethazine DM cough syrup for congestion/cough. This could make you drowsy, so take at night before bed. -Tessalon (Benzonatate) as needed for cough. Take one pill up to 3x daily (every 8 hours) -Albuterol inhaler as needed for cough and shortness of breath -Seek additional medical attention if symptoms are getting worse instead of better- shortness of breath, new fevers/chills, etc.      ED Prescriptions     Medication Sig Dispense Auth. Provider   albuterol (VENTOLIN HFA) 108 (90 Base) MCG/ACT inhaler Inhale 1-2 puffs into the lungs every 6 (six) hours as needed for wheezing or shortness of breath. 1 each Rhys MartiniGraham, Shellene Sweigert E, PA-C   benzonatate (TESSALON) 100 MG capsule Take 1-2 capsules (100-200 mg total) by mouth 3 (three) times daily as needed. 60 capsule Rhys MartiniGraham, Zykee Avakian E, PA-C   promethazine-dextromethorphan (PROMETHAZINE-DM) 6.25-15 MG/5ML syrup Take 5 mLs by mouth 4 (four) times daily as needed for cough. 118 mL Rhys MartiniGraham, Prince Olivier E, PA-C   predniSONE  (STERAPRED UNI-PAK 21 TAB) 10 MG (21) TBPK tablet Take by mouth daily. Take 6 tabs by mouth daily  for 2 days, then 5 tabs for 2 days, then 4 tabs for 2 days, then 3 tabs for 2 days, 2 tabs for 2 days, then 1 tab by mouth daily for 2 days 42 tablet Rhys MartiniGraham, Stiven Kaspar E, PA-C      PDMP not reviewed this encounter.   Rhys MartiniGraham, Letisia Schwalb E, PA-C 01/22/21 1624

## 2021-01-22 NOTE — Discharge Instructions (Addendum)
-  Prednisone taper for cough/bronchitis. I recommend taking this in the morning as it could give you energy.  Avoid NSAIDs like ibuprofen and alleve while taking this medication as they can increase your risk of stomach upset and even GI bleeding when in combination with a steroid. You can continue tylenol (acetaminophen) up to 1000mg  3x daily. -Promethazine DM cough syrup for congestion/cough. This could make you drowsy, so take at night before bed. -Tessalon (Benzonatate) as needed for cough. Take one pill up to 3x daily (every 8 hours) -Albuterol inhaler as needed for cough and shortness of breath -Seek additional medical attention if symptoms are getting worse instead of better- shortness of breath, new fevers/chills, etc.

## 2021-01-22 NOTE — ED Triage Notes (Signed)
Pt presents with productive cough. States OTC medications give no relief.

## 2021-01-31 ENCOUNTER — Encounter: Payer: Commercial Managed Care - HMO | Admitting: Family Medicine

## 2021-02-06 ENCOUNTER — Ambulatory Visit (INDEPENDENT_AMBULATORY_CARE_PROVIDER_SITE_OTHER): Payer: 59

## 2021-02-06 ENCOUNTER — Other Ambulatory Visit: Payer: Self-pay

## 2021-02-06 ENCOUNTER — Ambulatory Visit (HOSPITAL_COMMUNITY)
Admission: EM | Admit: 2021-02-06 | Discharge: 2021-02-06 | Disposition: A | Discharge status: Home / Self Care | Payer: 59 | Attending: Medical Oncology | Admitting: Medical Oncology

## 2021-02-06 ENCOUNTER — Encounter (HOSPITAL_COMMUNITY): Payer: Self-pay

## 2021-02-06 DIAGNOSIS — J40 Bronchitis, not specified as acute or chronic: Secondary | ICD-10-CM

## 2021-02-06 DIAGNOSIS — R059 Cough, unspecified: Secondary | ICD-10-CM

## 2021-02-06 MED ORDER — OMEPRAZOLE 20 MG PO CPDR: 20.0000 mg | DELAYED_RELEASE_CAPSULE | Freq: Every day | ORAL | 0 refills | Status: DC

## 2021-02-06 MED ORDER — BECLOMETHASONE DIPROP HFA 40 MCG/ACT IN AERB: 2.0000 | INHALATION_SPRAY | Freq: Two times a day (BID) | RESPIRATORY_TRACT | 0 refills | Status: DC

## 2021-02-06 NOTE — ED Triage Notes (Addendum)
Pt states her bronchitis has not gotten better with the medication she was prescribed on her last visit (01/22/2021). Pt has a non-productive cough. Pt states when lying down she hears herself wheezing.

## 2021-02-06 NOTE — ED Provider Notes (Signed)
[facility]    CSN: [id number] Arrival date & time: 02/06/21  1333      History   Chief Complaint Chief Complaint  Patient presents with   Cough    HPI Margaret Ramirez is a 60 y.o. female.   HPI  Cough: Pt reports a continued productive cough.  She was seen on 01/22/2021 and diagnosed with bronchitis.  She was given prednisone Dosepak along with Tussionex.  She has been using these medications along with an albuterol inhaler without much relief at all of symptoms.  Patient reports that her cough has continued despite these efforts and now affects her mostly when she is resting and laying back flat at night.  She reports that when she is lying down she hears herself wheezing.  She has no history of asthma or COPD.  She is a never smoker.  Past Medical History:  Diagnosis Date   Acute meniscal tear of left knee    s/p repair    Arthritis    Pemphigus vulgaris    Dr. Lovenia Kim   Reflux esophagitis 12/2010   EGD with Dr. Loreta Ave; grade 1 distal esophagitis and peptic-type duodenopathy on path   Vitamin D deficiency    Wears contact lenses     Patient Active Problem List   Diagnosis Date Noted   Obesity (BMI 30.0-34.9) 06/27/2015   Pemphigus vulgaris 06/27/2015   S/P left knee arthroscopy 03/14/2014   Menopausal symptom 07/26/2012   Esophagitis 02/03/2011    Past Surgical History:  Procedure Laterality Date   CESAREAN SECTION  1992   CHONDROPLASTY Left 03/14/2014   Procedure: CHONDROPLASTY;  Surgeon: Eugenia Mcalpine, MD;  Location: [facility];  Service: Orthopedics;  Laterality: Left;   COLONOSCOPY WITH ESOPHAGOGASTRODUODENOSCOPY (EGD)  01-31-2011   KNEE ARTHROSCOPY Left 03/14/2014   Procedure: ARTHROSCOPY KNEE;  Surgeon: Eugenia Mcalpine, MD;  Location: [facility];  Service: Orthopedics;  Laterality: Left;   KNEE ARTHROSCOPY WITH MEDIAL MENISECTOMY Left 03/14/2014   Procedure: KNEE ARTHROSCOPY WITH MEDIAL MENISECTOMY;  Surgeon:  Eugenia Mcalpine, MD;  Location: [facility];  Service: Orthopedics;  Laterality: Left;   LAPARSCOPIC RIGHT OVARIAN CYSTECTOMY  11-21-1999    OB History     Gravida  2   Para  2   Term      Preterm      AB      Living  2      SAB      IAB      Ectopic      Multiple      Live Births  2            Home Medications    Prior to Admission medications   Medication Sig Start Date End Date Taking? Authorizing Provider  acetaminophen (TYLENOL) 325 MG tablet Take 650 mg by mouth every 6 (six) hours as needed.    [provider]  albuterol (VENTOLIN HFA) 108 (90 Base) MCG/ACT inhaler Inhale 1-2 puffs into the lungs every 6 (six) hours as needed for wheezing or shortness of breath. 01/22/21   Rhys Martini, PA-C  benzonatate (TESSALON) 100 MG capsule Take 1-2 capsules (100-200 mg total) by mouth 3 (three) times daily as needed. 01/22/21   Rhys Martini, PA-C  cetirizine (ZYRTEC ALLERGY) 10 MG tablet Take 1 tablet (10 mg total) by mouth daily. 10/04/17   Wallis Bamberg, PA-C  cyclobenzaprine (FLEXERIL) 10 MG tablet Take 1 tablet (10 mg total) by mouth 2 (  two) times daily as needed for muscle spasms. 08/04/18   Fawze, Mina A, PA-C  HYDROcodone-acetaminophen (NORCO/VICODIN) 5-325 MG tablet Take 1 tablet by mouth every 6 (six) hours as needed for severe pain. 08/04/18   Fawze, Mina A, PA-C  HYDROcodone-homatropine (HYCODAN) 5-1.5 MG/5ML syrup Take 5 mLs by mouth at bedtime as needed. 10/04/17   Wallis Bamberg, PA-C  loratadine (CLARITIN) 10 MG tablet Take 1 tablet (10 mg total) by mouth daily. 10/04/17   Wallis Bamberg, PA-C  methylPREDNISolone (MEDROL DOSEPAK) 4 MG TBPK tablet Day 1: 8 mg PO before breakfast, 4 mg after lunch and after dinner, and 8 mg at bedtime Day 2: 4 mg PO before breakfast, after lunch, and after dinner and 8 mg at bedtime Day 3: 4 mg PO before breakfast, after lunch, after dinner, and at bedtime Day 4: 4 mg PO before breakfast, after lunch, and at  bedtime Day 5: 4 mg PO before breakfast and at bedtime Day 6: 4 mg PO before breakfast 08/04/18   Fawze, Mina A, PA-C  Multiple Vitamin (MULTIVITAMIN) tablet Take 1 tablet by mouth daily. Reported on 11/19/2015    [provider]  predniSONE (STERAPRED UNI-PAK 21 TAB) 10 MG (21) TBPK tablet Take by mouth daily. Take 6 tabs by mouth daily  for 2 days, then 5 tabs for 2 days, then 4 tabs for 2 days, then 3 tabs for 2 days, 2 tabs for 2 days, then 1 tab by mouth daily for 2 days 01/22/21   Rhys Martini, PA-C  promethazine-dextromethorphan (PROMETHAZINE-DM) 6.25-15 MG/5ML syrup Take 5 mLs by mouth 4 (four) times daily as needed for cough. 01/22/21   Rhys Martini, PA-C    Family History Family History  Problem Relation Age of Onset   Cancer Mother        lung   Asthma Mother    COPD Father        Emphysema   Heart disease Father    Hyperlipidemia Sister    Hyperlipidemia Brother    Hypertension Brother    COPD Paternal Grandfather        Black lung   Diabetes Neg Hx     Social History Social History   Tobacco Use   Smoking status: Never   Smokeless tobacco: Never  Substance Use Topics   Alcohol use: No    Alcohol/week: 0.0 standard drinks   Drug use: No     Allergies   Cephalexin and Cefzil [cefprozil]   Review of Systems Review of Systems  As stated above in HPI Physical Exam Triage Vital Signs ED Triage Vitals  Enc Vitals Group     BP 02/06/21 1433 121/64     Pulse Rate 02/06/21 1433 87     Resp 02/06/21 1433 18     Temp 02/06/21 1433 98 F (36.7 C)     Temp Source 02/06/21 1433 Oral     SpO2 02/06/21 1433 97 %     Weight --      Height --      Head Circumference --      Peak Flow --      Pain Score 02/06/21 1429 0     Pain Loc --      Pain Edu? --      Excl. in GC? --    No data found.  Updated Vital Signs BP 121/64 (BP Location: Right Arm)   Pulse 87   Temp 98 F (36.7 C) (Oral)   Resp 18  LMP 11/19/2008   SpO2 97%   Physical  Exam Vitals and nursing note reviewed.  Constitutional:      General: She is not in acute distress.    Appearance: Normal appearance. She is not ill-appearing, toxic-appearing or diaphoretic.  HENT:     Nose: Nose normal. No congestion or rhinorrhea.     Mouth/Throat:     Mouth: Mucous membranes are moist.     Pharynx: No oropharyngeal exudate or posterior oropharyngeal erythema.  Eyes:     Extraocular Movements: Extraocular movements intact.     Pupils: Pupils are equal, round, and reactive to light.  Cardiovascular:     Rate and Rhythm: Normal rate and regular rhythm.     Heart sounds: Normal heart sounds.  Pulmonary:     Effort: Pulmonary effort is normal.     Breath sounds: Normal breath sounds.  Lymphadenopathy:     Cervical: No cervical adenopathy.  Skin:    General: Skin is warm.  Neurological:     Mental Status: She is alert and oriented to person, place, and time.     UC Treatments / Results  Labs (all labs ordered are listed, but only abnormal results are displayed) Labs Reviewed - No data to display  EKG   Radiology No results found.  Procedures Procedures (including critical care time)  Medications Ordered in UC Medications - No data to display  Initial Impression / Assessment and Plan / UC Course  I have reviewed the triage vital signs and the nursing notes.  Pertinent labs & imaging results that were available during my care of the patient were reviewed by me and considered in my medical decision making (see chart for details).     New.  Likely bronchitis from viral illness that is continuing along with potential GERD.  Sending in omeprazole trial along with fluticasone inhaler.  Discussed red flag signs symptoms.  Encourage follow-up with PCP. Final Clinical Impressions(s) / UC Diagnoses   Final diagnoses:  None   Discharge Instructions   None    ED Prescriptions   None    PDMP not reviewed this encounter.   Rushie Chestnut,  [place] 02/06/21 1541

## 2021-04-16 ENCOUNTER — Other Ambulatory Visit: Payer: Self-pay

## 2021-04-16 ENCOUNTER — Encounter (HOSPITAL_BASED_OUTPATIENT_CLINIC_OR_DEPARTMENT_OTHER): Payer: Self-pay | Admitting: Nurse Practitioner

## 2021-04-16 ENCOUNTER — Ambulatory Visit (HOSPITAL_BASED_OUTPATIENT_CLINIC_OR_DEPARTMENT_OTHER): Payer: 59 | Admitting: Nurse Practitioner

## 2021-04-16 VITALS — BP 132/74 | HR 76 | Ht 69.0 in | Wt 250.0 lb

## 2021-04-16 DIAGNOSIS — R635 Abnormal weight gain: Secondary | ICD-10-CM | POA: Diagnosis not present

## 2021-04-16 DIAGNOSIS — R5383 Other fatigue: Secondary | ICD-10-CM | POA: Diagnosis not present

## 2021-04-16 DIAGNOSIS — G4700 Insomnia, unspecified: Secondary | ICD-10-CM

## 2021-04-16 DIAGNOSIS — Z0000 Encounter for general adult medical examination without abnormal findings: Secondary | ICD-10-CM | POA: Diagnosis not present

## 2021-04-16 DIAGNOSIS — L659 Nonscarring hair loss, unspecified: Secondary | ICD-10-CM | POA: Diagnosis not present

## 2021-04-16 MED ORDER — HYDROXYZINE HCL 25 MG PO TABS: 25.0000 mg | ORAL_TABLET | Freq: Every evening | ORAL | 11 refills | Status: DC | PRN

## 2021-04-16 NOTE — Assessment & Plan Note (Addendum)
Currently exercising (doing cardio) 3-4 times a week and eats pretty healthy. Will check labs today to rule out diabetes, any autoimmune disorders or thyroid disorder that could be causing the hair thinning or weight gain. Depending on the lab results, will discuss medication for weight management.

## 2021-04-16 NOTE — Progress Notes (Signed)
New Patient Office Visit  Subjective:  Patient ID: Margaret Ramirez, female    DOB: 09/13/1960  Age: 60 y.o. MRN: [id number]  CC:  Chief Complaint  Patient presents with   Establish Care    No Prior PCP - Was seeing OB for annual exams - [facility]. GI - [facility]   Weight Gain    Patient would like to discuss her weight gain. She is 5 months post op of a right meniscus repair and states shes noticed that she's gained some weight.   Hair thinning    Patient states she noticed that her hair is thinning. She just attributes it to "becoming older"    HPI Margaret Ramirez presents for establishing care.   Weight gain and hair thinning: She states she is concerned about her weight gain over the past few years. She retired from her previous job about seven years ago and now works part-time as a Clinical cytogeneticist for [employer]. States she gained about 70 lbs since her retirement even though she has remained very active. She exercises 3-4 times a week and does cardio. She did have knee surgery in April and she has been doing well with that. Over the last few weeks, she has been walking four miles a day. Her hair thinning started around the same time. She has had labs done in April, 2022 but is unsure what they checked but she was told that her TSH was normal at that time. She denies any chest pain, shortness of breath or any unusual headaches.   Fatigue: States that she is not sleeping well. She sleeps about 3-4 hours a night. It is hard for her to fall asleep or when she does, she will wake up after 3-4 hours and can not go back to sleep. She states that her husband has noticed that she has started snoring. She wakes up feeling tired in the next.   Past Medical History:  Diagnosis Date   Acute meniscal tear of left knee    s/p repair    Arthritis    Pemphigus vulgaris    Dr. Lovenia Kim   Reflux esophagitis 12/2010   EGD with Dr. Loreta Ave; grade 1 distal esophagitis and peptic-type  duodenopathy on path   Vitamin D deficiency    Wears contact lenses     Past Surgical History:  Procedure Laterality Date   CESAREAN SECTION  1992   CHONDROPLASTY Left 03/14/2014   Procedure: CHONDROPLASTY;  Surgeon: Eugenia Mcalpine, MD;  Location: [facility];  Service: Orthopedics;  Laterality: Left;   COLONOSCOPY WITH ESOPHAGOGASTRODUODENOSCOPY (EGD)  01-31-2011   KNEE ARTHROSCOPY Left 03/14/2014   Procedure: ARTHROSCOPY KNEE;  Surgeon: Eugenia Mcalpine, MD;  Location: [facility];  Service: Orthopedics;  Laterality: Left;   KNEE ARTHROSCOPY WITH MEDIAL MENISECTOMY Left 03/14/2014   Procedure: KNEE ARTHROSCOPY WITH MEDIAL MENISECTOMY;  Surgeon: Eugenia Mcalpine, MD;  Location: [facility];  Service: Orthopedics;  Laterality: Left;   LAPARSCOPIC RIGHT OVARIAN CYSTECTOMY  11-21-1999    Family History  Problem Relation Age of Onset   Cancer Mother        lung   Asthma Mother    COPD Father        Emphysema   Heart disease Father    Hyperlipidemia Sister    Hyperlipidemia Brother    Hypertension Brother    COPD Paternal Grandfather        Black lung   Diabetes Neg Hx  Social History   Socioeconomic History   Marital status: Married    Spouse name: Not on file   Number of children: 2   Years of education: Not on file   Highest education level: Not on file  Occupational History   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  Lives with husband.  Children are grown and graduated college, son in [place], [place], other son will be moving to [place] 07/2015  (daughter is step-daughter, lives locally).   3 grandchildren (will be moving to [facility]).   She retired from [employer] in 06/2015.   Social Determinants of Health   Financial Resource Strain: Not on file  Food Insecurity: Not on file  Transportation Needs: Not on file  Physical Activity: Not on file  Stress: Not on file  Social Connections: Not on file  Intimate Partner Violence: Not on file    ROS Review of Systems  All other systems reviewed and are negative.  Objective:   Today's Vitals: BP 132/74   Pulse 76   Ht 5\' 9"  (1.753 m)   Wt 250 lb (113.4 kg)   LMP 11/19/2008   SpO2 98%   BMI 36.92 kg/m   Physical Exam Vitals and nursing note reviewed.  Cardiovascular:     Rate and Rhythm: Normal rate and regular rhythm.     Pulses: Normal pulses.     Heart sounds: Normal heart sounds.  Pulmonary:     Effort: Pulmonary effort is normal.     Breath sounds: Normal breath sounds.  Neurological:     Mental Status: She is alert.  Psychiatric:        Mood and Affect: Mood normal.        Behavior: Behavior normal.    Assessment & Plan:   Problem List Items Addressed This Visit     Weight gain    Currently exercising (doing cardio) 3-4 times a week and eats pretty healthy. Will check labs today to rule out diabetes, any autoimmune disorders or thyroid disorder that could be causing the hair thinning or weight gain. Depending on the lab results, will discuss medication for weight management.      Relevant Orders   Hemoglobin A1c   Thyroid Panel With TSH   VITAMIN D 25 Hydroxy (Vit-D Deficiency, Fractures)   CBC with Differential/Platelet   Comprehensive metabolic panel   Fatigue    Her GAD score is 0, however she is not stressed but sometimes it is hard to turn her brain off. Discussed starting low Hydroxyzine to see if this helps her symptoms. Advised patient it is ok to take second dose if the first dose is not effective after 30 mins. Follow up in 2-3 months or sooner if symptoms worse or new symptoms develop.        Relevant Orders   Hemoglobin A1c   Thyroid Panel With TSH   VITAMIN D 25 Hydroxy (Vit-D Deficiency, Fractures)   CBC with Differential/Platelet   Comprehensive metabolic panel   Hair loss    See Weight gain.      Relevant Orders  Hemoglobin A1c   Thyroid Panel With TSH   VITAMIN D 25 Hydroxy (Vit-D Deficiency, Fractures)   CBC with Differential/Platelet   Comprehensive metabolic panel   Other Visit Diagnoses     Encounter for medical examination to establish care    -  Primary   Relevant Orders   Hemoglobin A1c   Thyroid Panel With TSH   VITAMIN D 25 Hydroxy (Vit-D Deficiency, Fractures)   CBC with Differential/Platelet   Comprehensive metabolic panel   Insomnia, unspecified type       Relevant Medications   hydrOXYzine (ATARAX/VISTARIL) 25 MG tablet       Outpatient Encounter Medications as of 04/16/2021  Medication Sig   hydrOXYzine (ATARAX/VISTARIL) 25 MG tablet Take 1 tablet (25 mg total) by mouth at bedtime and may repeat dose one time if needed. For sleep and anxiety   [DISCONTINUED] acetaminophen (TYLENOL) 325 MG tablet Take 650 mg by mouth every 6 (six) hours as needed.   [DISCONTINUED] albuterol (VENTOLIN HFA) 108 (90 Base) MCG/ACT inhaler Inhale 1-2 puffs into the lungs every 6 (six) hours as needed for wheezing or shortness of breath.   [DISCONTINUED] beclomethasone (QVAR) 40 MCG/ACT inhaler Inhale 2 puffs into the lungs 2 (two) times daily.   [DISCONTINUED] benzonatate (TESSALON) 100 MG capsule Take 1-2 capsules (100-200 mg total) by mouth 3 (three) times daily as needed.   [DISCONTINUED] cetirizine (ZYRTEC ALLERGY) 10 MG tablet Take 1 tablet (10 mg total) by mouth daily.   [DISCONTINUED] cyclobenzaprine (FLEXERIL) 10 MG tablet Take 1 tablet (10 mg total) by mouth 2 (two) times daily as needed for muscle spasms.   [DISCONTINUED] HYDROcodone-acetaminophen (NORCO/VICODIN) 5-325 MG tablet Take 1 tablet by mouth every 6 (six) hours as needed for severe pain.    [DISCONTINUED] HYDROcodone-homatropine (HYCODAN) 5-1.5 MG/5ML syrup Take 5 mLs by mouth at bedtime as needed.   [DISCONTINUED] loratadine (CLARITIN) 10 MG tablet Take 1 tablet (10 mg total) by mouth daily.   [DISCONTINUED] methylPREDNISolone (MEDROL DOSEPAK) 4 MG TBPK tablet Day 1: 8 mg PO before breakfast, 4 mg after lunch and after dinner, and 8 mg at bedtime Day 2: 4 mg PO before breakfast, after lunch, and after dinner and 8 mg at bedtime Day 3: 4 mg PO before breakfast, after lunch, after dinner, and at bedtime Day 4: 4 mg PO before breakfast, after lunch, and at bedtime Day 5: 4 mg PO before breakfast and at bedtime Day 6: 4 mg PO before breakfast   [DISCONTINUED] Multiple Vitamin (MULTIVITAMIN) tablet Take 1 tablet by mouth daily. Reported on 11/19/2015   [DISCONTINUED] omeprazole (PRILOSEC) 20 MG capsule Take 1 capsule (20 mg total) by mouth daily.   [DISCONTINUED] predniSONE (STERAPRED UNI-PAK 21 TAB) 10 MG (21) TBPK tablet Take by mouth daily. Take 6 tabs by mouth daily  for 2 days, then 5 tabs for 2 days, then 4 tabs for 2 days, then 3 tabs for 2 days, 2 tabs for 2 days, then 1 tab by mouth daily for 2 days   [DISCONTINUED] promethazine-dextromethorphan (PROMETHAZINE-DM) 6.25-15 MG/5ML syrup Take 5 mLs by mouth 4 (four) times daily as needed for cough.   No facility-administered encounter medications on file as of 04/16/2021.    Follow-up: Return for CPE in next few months.   Modesto Charon, Adelfa Koh, DNP STUDENT   Patient seen along with NP student, Modesto Charon, RN, BSN, DNP Student at today's visit. Portions of documentation and assessment have been completed by the student listed.  Suzanna Obey, NP,  have reviewed all documentation completed by the student. The documentation on 04/16/21 for the exam, diagnosis, procedures, and orders are all accurate and complete.  Suzanna Obey, NP,have personally seen and evaluated the patient during this encounter.  While the patient was in  clinic, I reviewed the patient's medical history, the student's findings on physical examination, and the patient's diagnosis and treatment plan. All aspects of care were discussed with the the student and I agree with the information documented.   Kinsey Karch, Sung Amabile, NP, DNp, AGNP-c Primary Care & Sports Medicine at [facility], [facility]

## 2021-04-16 NOTE — Assessment & Plan Note (Signed)
Her GAD score is 0, however she is not stressed but sometimes it is hard to turn her brain off. Discussed starting low Hydroxyzine to see if this helps her symptoms. Advised patient it is ok to take second dose if the first dose is not effective after 30 mins. Follow up in 2-3 months or sooner if symptoms worse or new symptoms develop.

## 2021-04-16 NOTE — Assessment & Plan Note (Signed)
See Weight gain.

## 2021-04-16 NOTE — Patient Instructions (Addendum)
Thank you for choosing [facility] for your Primary Care needs. I am excited for the opportunity to partner with you to meet your health care goals. It was a pleasure meeting you today!  Recommendations from today's visit: We will  get some labs today to see if we can determine any causes of the weight gain and hair thinning and other symptoms.  Information on diet, exercise, and health maintenance recommendations are listed below. This is information to help you be sure you are on track for optimal health and monitoring.   Please look over this and let us know if you have any questions or if you have completed any of the health maintenance outside of [place] so that we can be sure your records are up to date.  ___________________________________________________________ About Me: I am an Adult-Geriatric Nurse Practitioner with a background in caring for patients for more than 20 years with a strong intensive care background. I provide primary care and sports medicine services to patients age 56 and older within this office. My education had a strong focus on caring for the older adult population, which I am passionate about. I am also the director of the APP Fellowship with [facility].   My desire is to provide you with the best service through preventive medicine and supportive care. I consider you a part of the medical team and value your input. I work diligently to ensure that you are heard and your needs are met in a safe and effective manner. I want you to feel comfortable with me as your provider and want you to know that your health concerns are important to me.  For your information, our office hours are: Monday, Tuesday, and Thursday 8:00 AM - 5:00 PM Wednesday and Friday 8:00 AM - 12:00 PM.   In my time away from the office I am teaching new APP's within the system and am unavailable, but my partner, Dr. Burnard Bunting is in the office for emergent needs.    If you have questions or concerns, please call our office at [phone] or send [place] a MyChart message and we will respond as quickly as possible.  ____________________________________________________________ MyChart:  For all urgent or time sensitive needs we ask that you please call the office to avoid delays. Our number is [phone]. MyChart is not constantly monitored and due to the large volume of messages a day, replies may take up to 72 business hours.  MyChart Policy: MyChart allows for you to see your visit notes, after visit summary, provider recommendations, lab and tests results, make an appointment, request refills, and contact your provider or the office for non-urgent questions or concerns. Providers are seeing patients during normal business hours and do not have built in time to review MyChart messages.  We ask that you allow a minimum of 3 business days for responses to [employer]. For this reason, please do not send urgent requests through [place]. Please call the office at [phone]. New and ongoing conditions may require a visit. We have virtual and in person visit available for your convenience.  Complex MyChart concerns may require a visit. Your provider may request you schedule a virtual or in person visit to ensure we are providing the best care possible. MyChart messages sent after 11:00 AM on Friday will not be received by the provider until Monday morning.    Lab and Test Results: You will receive your lab and test results on MyChart as soon  as they are completed and results have been sent by the lab or testing facility. Due to this service, you will receive your results BEFORE your provider.  I review lab and tests results each morning prior to seeing patients. Some results require collaboration with other providers to ensure you are receiving the most appropriate care. For this reason, we ask that you please allow a minimum of 3-5 business days from the  time the ALL results have been received for your provider to receive and review lab and test results and contact you about these.  Most lab and test result comments from the provider will be sent through [place]. Your provider may recommend changes to the plan of care, follow-up visits, repeat testing, ask questions, or request an office visit to discuss these results. You may reply directly to this message or call the office at [phone] to provide information for the provider or set up an appointment. In some instances, you will be called with test results and recommendations. Please let us know if this is preferred and we will make note of this in your chart to provide this for you.    If you have not heard a response to your lab or test results in 5 business days from all results returning to [place], please call the office to let us know. We ask that you please avoid calling prior to this time unless there is an emergent concern. Due to high call volumes, this can delay the resulting process.  After Hours: For all non-emergency after hours needs, please call the office at [phone] and select the option to reach the on-call provider service. On-call services are shared between multiple [place] offices and therefore it will not be possible to speak directly with your provider. On-call providers may provide medical advice and recommendations, but are unable to provide refills for maintenance medications.  For all emergency or urgent medical needs after normal business hours, we recommend that you seek care at the closest Urgent Care or Emergency Department to ensure appropriate treatment in a timely manner.  [facility] has a 24 hour emergency room located on the ground floor for your convenience.   Urgent Concerns During the Business Day Providers are seeing patients from 8AM to [place] with a busy schedule and are most often not able to respond to non-urgent calls until  the end of the day or the next business day. If you should have URGENT concerns during the day, please call and speak to the nurse or schedule a same day appointment so that we can address your concern without delay.   Thank you, again, for choosing me as your health care partner. I appreciate your trust and look forward to learning more about you.   Margaret Keeler, DNP, AGNP-c ___________________________________________________________  Health Maintenance Recommendations Screening Testing Mammogram Every 1 -2 years based on history and risk factors Starting at age 16 Pap Smear Ages 21-39 every 3 years Ages 57-65 every 5 years with HPV testing More frequent testing may be required based on results and history Colon Cancer Screening Every 1-10 years based on test performed, risk factors, and history Starting at age 43 Bone Density Screening Every 2-10 years based on history Starting at age 26 for women Recommendations for men differ based on medication usage, history, and risk factors AAA Screening One time ultrasound Men 30-70 years old who have every smoked Lung Cancer Screening Low Dose Lung CT every 12 months Age 20-80 years with  a 30 pack-year smoking history who still smoke or who have quit within the last 15 years  Screening Labs Routine  Labs: Complete Blood Count (CBC), Complete Metabolic Panel (CMP), Cholesterol (Lipid Panel) Every 6-12 months based on history and medications May be recommended more frequently based on current conditions or previous results Hemoglobin A1c Lab Every 3-12 months based on history and previous results Starting at age 67 or earlier with diagnosis of diabetes, high cholesterol, BMI >26, and/or risk factors Frequent monitoring for patients with diabetes to ensure blood sugar control Thyroid Panel (TSH w/ T3 & T4) Every 6 months based on history, symptoms, and risk factors May be repeated more often if on medication HIV One time testing  for all patients 59 and older May be repeated more frequently for patients with increased risk factors or exposure Hepatitis C One time testing for all patients 66 and older May be repeated more frequently for patients with increased risk factors or exposure Gonorrhea, Chlamydia Every 12 months for all sexually active persons 13-24 years Additional monitoring may be recommended for those who are considered high risk or who have symptoms PSA Men 69-32 years old with risk factors Additional screening may be recommended from age 59-69 based on risk factors, symptoms, and history  Vaccine Recommendations Tetanus Booster All adults every 10 years Flu Vaccine All patients 6 months and older every year COVID Vaccine All patients 12 years and older Initial dosing with booster May recommend additional booster based on age and health history HPV Vaccine 2 doses all patients age 14-26 Dosing may be considered for patients over 26 Shingles Vaccine (Shingrix) 2 doses all adults 19 years and older Pneumonia (Pneumovax 23) All adults 83 years and older May recommend earlier dosing based on health history Pneumonia (Prevnar 47) All adults 70 years and older Dosed 1 year after Pneumovax 23  Additional Screening, Testing, and Vaccinations may be recommended on an individualized basis based on family history, health history, risk factors, and/or exposure.  __________________________________________________________  Diet Recommendations for All Patients  I recommend that all patients maintain a diet low in saturated fats, carbohydrates, and cholesterol. While this can be challenging at first, it is not impossible and small changes can make big differences.  Things to try: Decreasing the amount of soda, sweet tea, and/or juice to one or less per day and replace with water While water is always the first choice, if you do not like water you may consider adding a water additive without sugar to  improve the taste other sugar free drinks Replace potatoes with a brightly colored vegetable at dinner Use healthy oils, such as canola oil or olive oil, instead of butter or hard margarine Limit your bread intake to two pieces or less a day Replace regular pasta with low carb pasta options Bake, broil, or grill foods instead of frying Monitor portion sizes  Eat smaller, more frequent meals throughout the day instead of large meals  An important thing to remember is, if you love foods that are not great for your health, you don't have to give them up completely. Instead, allow these foods to be a reward when you have done well. Allowing yourself to still have special treats every once in a while is a nice way to tell yourself thank you for working hard to keep yourself healthy.   Also remember that every day is a new day. If you have a bad day and "fall off the wagon", you can still climb right  back up and keep moving along on your journey!  We have resources available to help you!  Some websites that may be helpful include: www.http://carter.biz/  Www.VeryWellFit.com _____________________________________________________________  Activity Recommendations for All Patients  I recommend that all adults get at least 20 minutes of moderate physical activity that elevates your heart rate at least 5 days out of the week.  Some examples include: Walking or jogging at a pace that allows you to carry on a conversation Cycling (stationary bike or outdoors) Water aerobics Yoga Weight lifting Dancing If physical limitations prevent you from putting stress on your joints, exercise in a pool or seated in a chair are excellent options.  Do determine your MAXIMUM heart rate for activity: YOUR AGE - 220 = MAX HeartRate   Remember! Do not push yourself too hard.  Start slowly and build up your pace, speed, weight, time in exercise, etc.  Allow your body to rest between exercise and get good sleep. You  will need more water than normal when you are exerting yourself. Do not wait until you are thirsty to drink. Drink with a purpose of getting in at least 8, 8 ounce glasses of water a day plus more depending on how much you exercise and sweat.    If you begin to develop dizziness, chest pain, abdominal pain, jaw pain, shortness of breath, headache, vision changes, lightheadedness, or other concerning symptoms, stop the activity and allow your body to rest. If your symptoms are severe, seek emergency evaluation immediately. If your symptoms are concerning, but not severe, please let us know so that we can recommend further evaluation.

## 2021-04-17 LAB — COMPREHENSIVE METABOLIC PANEL
ALT: 14 IU/L (ref 0–32)
AST: 19 IU/L (ref 0–40)
Albumin/Globulin Ratio: 1.6 (ref 1.2–2.2)
Albumin: 4.6 g/dL (ref 3.8–4.9)
Alkaline Phosphatase: 115 IU/L (ref 44–121)
BUN/Creatinine Ratio: 17 (ref 12–28)
BUN: 14 mg/dL (ref 8–27)
Bilirubin Total: <0.2 mg/dL (ref 0.0–1.2)
CO2: 27 mmol/L (ref 20–29)
Calcium: 9.5 mg/dL (ref 8.7–10.3)
Chloride: 101 mmol/L (ref 96–106)
Creatinine, Ser: 0.83 mg/dL (ref 0.57–1.00)
Globulin, Total: 2.9 g/dL (ref 1.5–4.5)
Glucose: 105 mg/dL — ABNORMAL HIGH (ref 70–99)
Potassium: 4.5 mmol/L (ref 3.5–5.2)
Sodium: 141 mmol/L (ref 134–144)
Total Protein: 7.5 g/dL (ref 6.0–8.5)
eGFR: 81 mL/min/{1.73_m2} (ref >59)

## 2021-04-17 LAB — CBC WITH DIFFERENTIAL/PLATELET
Basophils Absolute: 0.1 10*3/uL (ref 0.0–0.2)
Basos: 2 %
EOS (ABSOLUTE): 0.2 10*3/uL (ref 0.0–0.4)
Eos: 3 %
Hematocrit: 37.9 % (ref 34.0–46.6)
Hemoglobin: 12.6 g/dL (ref 11.1–15.9)
Immature Grans (Abs): 0 10*3/uL (ref 0.0–0.1)
Immature Granulocytes: 0 %
Lymphocytes Absolute: 2.3 10*3/uL (ref 0.7–3.1)
Lymphs: 30 %
MCH: 28.3 pg (ref 26.6–33.0)
MCHC: 33.2 g/dL (ref 31.5–35.7)
MCV: 85 fL (ref 79–97)
Monocytes Absolute: 0.6 10*3/uL (ref 0.1–0.9)
Monocytes: 8 %
Neutrophils Absolute: 4.4 10*3/uL (ref 1.4–7.0)
Neutrophils: 57 %
Platelets: 351 10*3/uL (ref 150–450)
RBC: 4.45 x10E6/uL (ref 3.77–5.28)
RDW: 14.3 % (ref 11.7–15.4)
WBC: 7.6 10*3/uL (ref 3.4–10.8)

## 2021-04-17 LAB — THYROID PANEL WITH TSH
Free Thyroxine Index: 1.7 (ref 1.2–4.9)
T3 Uptake Ratio: 25 % (ref 24–39)
T4, Total: 6.8 ug/dL (ref 4.5–12.0)
TSH: 1.68 u[IU]/mL (ref 0.450–4.500)

## 2021-04-17 LAB — HEMOGLOBIN A1C
Est. average glucose Bld gHb Est-mCnc: 126 mg/dL
Hgb A1c MFr Bld: 6 % — ABNORMAL HIGH (ref 4.8–5.6)

## 2021-04-17 LAB — VITAMIN D 25 HYDROXY (VIT D DEFICIENCY, FRACTURES): Vit D, 25-Hydroxy: 34.2 ng/mL (ref 30.0–100.0)

## 2021-04-23 ENCOUNTER — Ambulatory Visit (HOSPITAL_BASED_OUTPATIENT_CLINIC_OR_DEPARTMENT_OTHER): Payer: 59 | Admitting: Nurse Practitioner

## 2021-04-23 ENCOUNTER — Encounter (HOSPITAL_BASED_OUTPATIENT_CLINIC_OR_DEPARTMENT_OTHER): Payer: Self-pay | Admitting: Nurse Practitioner

## 2021-04-23 ENCOUNTER — Other Ambulatory Visit: Payer: Self-pay

## 2021-04-23 VITALS — BP 102/68 | HR 52 | Ht 69.0 in | Wt 253.4 lb

## 2021-04-23 DIAGNOSIS — Z23 Encounter for immunization: Secondary | ICD-10-CM

## 2021-04-23 DIAGNOSIS — Z6837 Body mass index (BMI) 37.0-37.9, adult: Secondary | ICD-10-CM

## 2021-04-23 DIAGNOSIS — E782 Mixed hyperlipidemia: Secondary | ICD-10-CM | POA: Diagnosis not present

## 2021-04-23 DIAGNOSIS — I10 Essential (primary) hypertension: Secondary | ICD-10-CM | POA: Diagnosis not present

## 2021-04-23 DIAGNOSIS — G43909 Migraine, unspecified, not intractable, without status migrainosus: Secondary | ICD-10-CM | POA: Insufficient documentation

## 2021-04-23 DIAGNOSIS — R7303 Prediabetes: Secondary | ICD-10-CM

## 2021-04-23 MED ORDER — PEN NEEDLES 33G X 4 MM MISC: 1.0000 [IU] | 0 refills | Status: DC

## 2021-04-23 MED ORDER — OZEMPIC (0.25 OR 0.5 MG/DOSE) 2 MG/1.5ML ~~LOC~~ SOPN: PEN_INJECTOR | SUBCUTANEOUS | 0 refills | Status: DC

## 2021-04-23 MED ORDER — SEMAGLUTIDE (1 MG/DOSE) 4 MG/3ML ~~LOC~~ SOPN: 1.0000 mg | PEN_INJECTOR | SUBCUTANEOUS | 0 refills | Status: DC

## 2021-04-23 NOTE — Progress Notes (Signed)
Established Patient Office Visit  Subjective:  Patient ID: Margaret Ramirez, female    DOB: [birth year]  Age: 60 y.o. MRN: [id number]  CC:  Chief Complaint  Patient presents with   Follow-up    Patient presents in office to follow up on labs from last office visit.     HPI Margaret Ramirez presents for follow-up for recent lab work and recommendations.  On recent labs, A1c was found to be 6.0% with a new diagnosis of prediabetes.  She does not have any known first or second degree relatives with diabetes.  She has had elevated blood pressure readings recently.  Her current BMI is 36.92%. She has tried phentermine in the past for weight loss without much success.   Her other labs were normal. We did not monitor lipids at her recent visit as she was not fasting.   At her initial visit she had concerns of fatigue, weight gain, and thinning hair.  She had a 70lb weight gain over the past several years despite routine exercise (cardio 3-4 days a week) and monitoring her diet closely.   ROS Review of Systems All review of systems negative except what is listed in the HPI    Objective:    Physical Exam Vitals and nursing note reviewed.  Constitutional:      Appearance: Normal appearance.  HENT:     Head: Normocephalic.  Cardiovascular:     Rate and Rhythm: Normal rate.     Pulses: Normal pulses.  Pulmonary:     Effort: Pulmonary effort is normal.  Skin:    Capillary Refill: Capillary refill takes less than 2 seconds.  Neurological:     General: No focal deficit present.     Mental Status: She is alert and oriented to person, place, and time.  Psychiatric:        Mood and Affect: Mood normal.        Behavior: Behavior normal.        Thought Content: Thought content normal.        Judgment: Judgment normal.    BP 102/68   Pulse (!) 52   Ht 5' 9" (1.753 m)   Wt 253 lb 6.4 oz (114.9 kg)   LMP 11/19/2008   SpO2 97%   BMI 37.42 kg/m  Wt Readings from Last 3  Encounters:  04/23/21 253 lb 6.4 oz (114.9 kg)  04/16/21 250 lb (113.4 kg)  08/04/18 220 lb (99.8 kg)    Lab Results  Component Value Date   TSH 1.680 04/16/2021   Lab Results  Component Value Date   WBC 7.6 04/16/2021   HGB 12.6 04/16/2021   HCT 37.9 04/16/2021   MCV 85 04/16/2021   PLT 351 04/16/2021   Lab Results  Component Value Date   NA 141 04/16/2021   K 4.5 04/16/2021   CO2 27 04/16/2021   GLUCOSE 105 (H) 04/16/2021   BUN 14 04/16/2021   CREATININE 0.83 04/16/2021   BILITOT <0.2 04/16/2021   ALKPHOS 115 04/16/2021   AST 19 04/16/2021   ALT 14 04/16/2021   PROT 7.5 04/16/2021   ALBUMIN 4.6 04/16/2021   CALCIUM 9.5 04/16/2021   EGFR 81 04/16/2021   Lab Results  Component Value Date   HGBA1C 6.0 (H) 04/16/2021      Assessment & Plan:   Problem List Items Addressed This Visit     Class 2 severe obesity with serious comorbidity and body mass index (BMI) of 37.0 to 37.9 in  adult [facility])    New diagnosis of pre-DM in setting of elevated BMI and elevated lipids.  Diet and exercise measures have been unsuccessful.  Given co-morbidities and increased CV risk plan to start Ozempic.  Prescription sent to pharmacy and patient education provided.  Will follow-up in 3 months for A1c and weight check.       Relevant Medications   Semaglutide,0.25 or 0.5MG/DOS, (OZEMPIC, 0.25 OR 0.5 MG/DOSE,) 2 MG/1.5ML SOPN   Semaglutide, 1 MG/DOSE, 4 MG/3ML SOPN   Pre-diabetes - Primary    A1c elevated to 6.0% on recent labs Given co morbidities, will plan to begin Ozempic for CV and blood sugar control. Patient educated on diet and exercise activities to help with blood sugar control.  Will plan to f/u in 3 months for A1c and weight.        Relevant Medications   Semaglutide,0.25 or 0.5MG/DOS, (OZEMPIC, 0.25 OR 0.5 MG/DOSE,) 2 MG/1.5ML SOPN   Semaglutide, 1 MG/DOSE, 4 MG/3ML SOPN   Insulin Pen Needle (PEN NEEDLES) 33G X 4 MM MISC   Other Relevant Orders   Flu Vaccine QUAD  6+ mos PF IM (Fluarix Quad PF) (Completed)   Other Visit Diagnoses     Hypertension, unspecified type       Relevant Medications   Semaglutide,0.25 or 0.5MG/DOS, (OZEMPIC, 0.25 OR 0.5 MG/DOSE,) 2 MG/1.5ML SOPN   Semaglutide, 1 MG/DOSE, 4 MG/3ML SOPN   Insulin Pen Needle (PEN NEEDLES) 33G X 4 MM MISC   Other Relevant Orders   Flu Vaccine QUAD 6+ mos PF IM (Fluarix Quad PF) (Completed)   Mixed hyperlipidemia       Relevant Medications   Semaglutide,0.25 or 0.5MG/DOS, (OZEMPIC, 0.25 OR 0.5 MG/DOSE,) 2 MG/1.5ML SOPN   Semaglutide, 1 MG/DOSE, 4 MG/3ML SOPN   Insulin Pen Needle (PEN NEEDLES) 33G X 4 MM MISC   Other Relevant Orders   Flu Vaccine QUAD 6+ mos PF IM (Fluarix Quad PF) (Completed)       Meds ordered this encounter  Medications   Semaglutide,0.25 or 0.5MG/DOS, (OZEMPIC, 0.25 OR 0.5 MG/DOSE,) 2 MG/1.5ML SOPN    Sig: Inject 0.25 mg into the skin once a week for 28 days, THEN 0.5 mg once a week for 28 days, THEN 1 mg once a week for 7 days.    Dispense:  3 mL    Refill:  0   Semaglutide, 1 MG/DOSE, 4 MG/3ML SOPN    Sig: Inject 1 mg as directed once a week. Start after completion of first 2 Pens.    Dispense:  3 mL    Refill:  0   Insulin Pen Needle (PEN NEEDLES) 33G X 4 MM MISC    Sig: 1 Units by Does not apply route once a week.    Dispense:  100 each    Refill:  0    Follow-up: Return in about 3 months (around 07/24/2021) for Ozempic- A1c.    Orma Render, NP

## 2021-04-23 NOTE — Assessment & Plan Note (Signed)
New diagnosis of pre-DM in setting of elevated BMI and elevated lipids.  Diet and exercise measures have been unsuccessful.  Given co-morbidities and increased CV risk plan to start Ozempic.  Prescription sent to pharmacy and patient education provided.  Will follow-up in 3 months for A1c and weight check.

## 2021-04-23 NOTE — Patient Instructions (Addendum)
Type 2 Pre-Diabetes  Diet Recommendations 150-180 grams of carbohydrates a day are best.  Low saturated fats.  Increase Fiber   Prediabetes is a precursor for Diabetes.   What this means is that your body is not able to use the sugar (glucose) you eat appropriately to provide fuel to the cells in your body. We call this INSULIN RESISTANCE.   Our pancreas produces insulin which acts as a "key" to unlock the door to the cells in our body to allow glucose to enter the cell and feed it.  When our cells become insulin resistant, they essentially change the locks and no longer allow the insulin to open the cell for the glucose to enter.  In pre-diabetes, not all the locks have changed, but only some of them. So some of our glucose is being used, but not to the full capacity.   As a result, the glucose that would normally be fueling our body stays in our blood stream. This results in higher levels than expected of blood glucose. This also results in our cells being hungry.   Our brain, as advanced and miraculous as it is, cannot tell that our blood is full of glucose. All it can see is that our cells our hungry. The response to this is a basic survival response: "We are starving and we might die". This results in the brain triggering the liver to spill out stored glucose to feed our cells and causes our body to store more fat in the event we "miss a meal" again.   This response is vital in times when our blood glucose levels really are low, however, in times of insulin resistance, this response is counterproductive and can be devastating to our system.   How? 1- Fat cells increase insulin resistance  Fat is one of the key factors in the "changing of the locks" on our cells to cause insulin to no longer work 2- High glucose is damaging Increased blood glucose levels in the blood wreck havoc on our organs and blood vessels causing       damage throughout out entire body.   What causes this in the  first place? It is widely believed that there is a genetic predisposition to insulin resistance. It is most certainly seen in families, but there are also components that add to the risk. Including; Lifestyle, Diet, Exercise,  and Exposure.  In people who have no family history, it is believed that some external factor plays a role in altering the way our body uses glucose.  Unfortunately, there is no easy answer, but once you have insulin resistance, it is preset for the rest of your life.   How do I manage this? The great news is, in the Margaret Ramirez stages (pre-diabetes) damage is at a minimum. High glucose levels have not been around long enough to cause major damage. This is the time we want to make big changes to PREVENT damage and further worsening of the condition.  In fact, many people with pre-diabetes can manage their condition and lead perfectly healthy, normal lives without some of the devastating consequences of full diabetes.   The key to management is to change the "locks" back and allow the insulin to work once again.  If left untreated, pre-diabetes can result in permanent changes to the cells, decreased insulin production, weight gain, severe organ damage, blindness, loss of limb, heart attack, stroke, and death. It has severe consequences, which is why Margaret Ramirez detection and changes are vital  to protect you.   Management Diet: Low carbohydrate, low saturated fats, high fiber diets are best I caution you to avoid diets that include high fatty proteins, as these can causes elevations in cholesterol levels.   Exercise: The more fuel your cells use, the more extra glucose is burned. What this means is, the cells that are still using insulin properly, use more of the extra glucose in the blood.  This also helps with weight loss, which reduces the insulin resistance of cells from fat cells.   Medication: There are many medications to help your body use insulin more efficiently and help  prevent damage to your body.  Metformin: "tried and true". This is one of the most widely used medications for diabetes. It is a pill taken daily (sometimes twice a day depending on the formulation) and works to help make cells able to use insulin again. The amount taken often depends on the severity of the insulin resistance.  Metformin can help with weight loss and reduce the cardiovascular effects of high glucose levels.   Side effects: Most commonly- diarrhea. Especially with intake of high fat or high sugar foods.   GLP-1: "the latest and greatest" These medications are newer to the market and show remarkable results when controlling blood sugar is not the only desired outcome.  They work to decrease the brain response to release extra glucose from the liver and storage of extra fat for future fuel, they slow down how fast food moves through your stomach causing you to feel fuller longer (resulting in less food intake), they help reduce insulin resistance of your cells (resulting in more glucose being used). These medications also help lower cholesterol levels and help with weight loss.  Side effects: You cannot use this medication if you have a history or family history of certain thyroid cancers.  There is a risk of pancreatitis in some people (this is rare, but may occur) Constipation, specifically with high fat and high sugar foods. Nausea, most of the time intermittent and related directly intake of large meals and high fat/carbohydrate meals.   There are multiple other medications, but these are the two stables for Pre-Diabetes.   It is important to note that medication alone will not be enough to cure or control the condition. It will also take lifestyle changes to help control this.  Diabetes is a PROGRESSIVE disease, meaning, it does not go away. Once you have diabetes, you can control your levels, but without control, it will continue to get worse as time goes on. Even some patients  with excellent control will find it more difficult to manage as they age due to the process of the disease.  I know this sounds devastating, but you have the advantage of Margaret Ramirez diagnosis and control, which if you take the right steps now, will lead to less chances of complications and worsening of the condition as you age.

## 2021-04-23 NOTE — Assessment & Plan Note (Signed)
A1c elevated to 6.0% on recent labs Given co morbidities, will plan to begin Ozempic for CV and blood sugar control. Patient educated on diet and exercise activities to help with blood sugar control.  Will plan to f/u in 3 months for A1c and weight.

## 2021-06-25 ENCOUNTER — Ambulatory Visit (INDEPENDENT_AMBULATORY_CARE_PROVIDER_SITE_OTHER): Payer: 59 | Admitting: Nurse Practitioner

## 2021-06-25 ENCOUNTER — Other Ambulatory Visit: Payer: Self-pay

## 2021-06-25 ENCOUNTER — Encounter (HOSPITAL_BASED_OUTPATIENT_CLINIC_OR_DEPARTMENT_OTHER): Payer: Self-pay | Admitting: Nurse Practitioner

## 2021-06-25 VITALS — BP 106/73 | HR 75 | Wt 223.8 lb

## 2021-06-25 DIAGNOSIS — E782 Mixed hyperlipidemia: Secondary | ICD-10-CM

## 2021-06-25 DIAGNOSIS — I10 Essential (primary) hypertension: Secondary | ICD-10-CM

## 2021-06-25 DIAGNOSIS — R7303 Prediabetes: Secondary | ICD-10-CM | POA: Diagnosis not present

## 2021-06-25 DIAGNOSIS — Z0000 Encounter for general adult medical examination without abnormal findings: Secondary | ICD-10-CM

## 2021-06-25 MED ORDER — SEMAGLUTIDE (1 MG/DOSE) 4 MG/3ML ~~LOC~~ SOPN: 1.0000 mg | PEN_INJECTOR | SUBCUTANEOUS | 3 refills | Status: DC

## 2021-06-25 NOTE — Patient Instructions (Addendum)
I am so proud of you!!! Keep up the great work!!!  Carbohydrates, try to stay 150-180 grams or less a day Saturated fats, try to stay 13-15 grams or less a day Fiber at least 30 grams a day  You can schedule for labs in February- the orders are already in.  We will plan to follow-up with everything again in 6 months.

## 2021-06-25 NOTE — Progress Notes (Signed)
BP 106/73    Pulse 75    Wt 223 lb 12.8 oz (101.5 kg)    LMP 11/19/2008    SpO2 100%    BMI 33.05 kg/m    Subjective:    Patient ID: Margaret Ramirez, female    DOB: 1960/07/05, 61 y.o.   MRN: [id number]  HPI: ELESHIA Ramirez is a 61 y.o. female presenting on 06/25/2021 for comprehensive medical examination.   Current medical concerns include:None She reports successful weight loss of 30lb in the last 2 months with start of Ozempic and working on diet and activity. She is working out 3 days a week for 40 minutes sessions on the treadmill and elliptical. She has significantly decreased her carbohydrate intake and is monitoring her intake.   Past Medical History:  Past Medical History:  Diagnosis Date   Acute meniscal tear of left knee    s/p repair    Arthritis    Pemphigus vulgaris    Dr. Derrel Nip   Reflux esophagitis 12/2010   EGD with Dr. Collene Mares; grade 1 distal esophagitis and peptic-type duodenopathy on path   Vitamin D deficiency    Wears contact lenses    Medications:  Current Outpatient Medications on File Prior to Visit  Medication Sig   clobetasol cream (TEMOVATE) 0.05 % clobetasol 0.05 % topical cream  APPLY 1 APPLICATION ON THE SKIN 1 APPLICATION APPLY ON THE SKIN TWICE A DAY TO NEW LESIONS   diclofenac Sodium (VOLTAREN) 1 % GEL Apply topically.   hydrOXYzine (ATARAX/VISTARIL) 25 MG tablet Take 1 tablet (25 mg total) by mouth at bedtime and may repeat dose one time if needed. For sleep and anxiety   Insulin Pen Needle (PEN NEEDLES) 33G X 4 MM MISC 1 Units by Does not apply route once a week.   promethazine (PHENERGAN) 25 MG tablet promethazine 25 mg tablet  TAKE 1 TABLET BY MOUTH EVERY 4 HOURS   No current facility-administered medications on file prior to visit.   Interim Problems from her last visit: no   She reports regular vision exams q1-5y: yes She reports regular dental exams q 108m yes Her diet consists of:  very healthy. Following no/low carbohydrate diet  with increased water and low fats She endorses exercise and/or activity of:  40 minutes at least 3 days/weak CV exercise She works at:  Tax office  She denies ETOH use  She denies nictoine use She denies illegal substance use  She is postmenopausal She reports no vaginal bleeding Current menopausal symptoms: no   She is currently sexually active with single partner She denies concerns today about STI  She denies concerns about skin changes today:  She denies concerns about bowel changes today:  She denies concerns about bladder changes today:   Surgical History:  Past Surgical History:  Procedure Laterality Date   CESAREAN SECTION  1992   CHONDROPLASTY Left 03/14/2014   Procedure: CHONDROPLASTY;  Surgeon: RSydnee Cabal MD;  [facility]  Service: Orthopedics;  Laterality: Left;   COLONOSCOPY WITH ESOPHAGOGASTRODUODENOSCOPY (EGD)  01-31-2011   KNEE ARTHROSCOPY Left 03/14/2014   Procedure: ARTHROSCOPY KNEE;  Surgeon: RSydnee Cabal MD;  [facility]  Service: Orthopedics;  Laterality: Left;   KNEE ARTHROSCOPY WITH MEDIAL MENISECTOMY Left 03/14/2014   Procedure: KNEE ARTHROSCOPY WITH MEDIAL MENISECTOMY;  Surgeon: RSydnee Cabal MD;  [facility]  Service: Orthopedics;  Laterality: Left;   LAPARSCOPIC RIGHT OVARIAN CYSTECTOMY  11-21-1999    Allergies:  Allergies  Allergen Reactions   Cephalexin Hives   Cefzil [Cefprozil] Rash    On thighs and forarms.    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  Lives with husband.  Children are grown and graduated college, son in [place], [place], other son will be moving to [place] 07/2015  (daughter is step-daughter, lives locally).  3 grandchildren (will be moving to [facility]).   She retired from [employer] in 06/2015.   Social Determinants of Health   Financial Resource Strain: Not on file  Food Insecurity: Not on file  Transportation Needs: Not on file  Physical Activity: Not on file  Stress: Not on file  Social Connections: Not on file  Intimate Partner Violence: Not on file   Social History   Tobacco Use  Smoking Status Never  Smokeless Tobacco Never   Social History   Substance and Sexual Activity  Alcohol Use No   Alcohol/week: 0.0 standard drinks    Family History:  Family History  Problem Relation Age of Onset   Cancer Mother        lung   Asthma Mother    COPD Father        Emphysema   Heart disease Father    Hyperlipidemia Sister    Hyperlipidemia Brother    Hypertension Brother    COPD Paternal Grandfather        Black lung   Diabetes Neg Hx     Past medical history, surgical history, medications, allergies, family history and social history reviewed with patient today and changes made to appropriate areas of the chart.   All ROS negative except what is listed above and in the HPI.      Objective:    BP 106/73    Pulse 75    Wt 223 lb 12.8 oz (101.5 kg)    LMP 11/19/2008    SpO2 100%    BMI 33.05 kg/m   Wt Readings from Last 3 Encounters:  06/25/21 223 lb 12.8 oz (101.5 kg)  04/23/21 253 lb 6.4 oz (114.9 kg)  04/16/21 250 lb (113.4 kg)    Physical Exam Vitals and nursing note reviewed.  Constitutional:      General: She is not in acute distress.    Appearance: Normal appearance.  HENT:     Head: Normocephalic and atraumatic.      Right Ear: Hearing, tympanic membrane, ear canal and external ear normal.     Left Ear: Hearing, tympanic membrane, ear canal and external ear normal.     Nose: Nose normal.     Right Sinus: No maxillary sinus tenderness or frontal sinus tenderness.     Left Sinus: No maxillary sinus tenderness or frontal sinus tenderness.     Mouth/Throat:     Lips: Pink.  Mouth: Mucous membranes are moist.     Pharynx: Oropharynx is clear.  Eyes:     General: Lids are normal. Vision grossly intact.     Extraocular Movements: Extraocular movements intact.     Conjunctiva/sclera: Conjunctivae normal.     Pupils: Pupils are equal, round, and reactive to light.     Funduscopic exam:    Right eye: Red reflex present.        Left eye: Red reflex present.    Visual Fields: Right eye visual fields normal and left eye visual fields normal.  Neck:     Thyroid: No thyromegaly.     Vascular: No carotid bruit.  Cardiovascular:     Rate and Rhythm: Normal rate and regular rhythm.     Chest Wall: PMI is not displaced.     Pulses: Normal pulses.          Dorsalis pedis pulses are 2+ on the right side and 2+ on the left side.       Posterior tibial pulses are 2+ on the right side and 2+ on the left side.     Heart sounds: Normal heart sounds. No murmur heard. Pulmonary:     Effort: Pulmonary effort is normal. No respiratory distress.     Breath sounds: Normal breath sounds.  Abdominal:     General: Abdomen is flat. Bowel sounds are normal. There is no distension.     Palpations: Abdomen is soft. There is no hepatomegaly, splenomegaly or mass.     Tenderness: There is no abdominal tenderness. There is no right CVA tenderness, left CVA tenderness, guarding or rebound.  Musculoskeletal:        General: Normal range of motion.     Cervical back: Full passive range of motion without pain, normal range of motion and neck supple. No tenderness.     Right lower leg: No edema.     Left lower leg: No edema.  Feet:      Left foot:     Toenail Condition: Left toenails are normal.  Lymphadenopathy:     Cervical: No cervical adenopathy.     Upper Body:     Right upper body: No supraclavicular adenopathy.     Left upper body: No supraclavicular adenopathy.  Skin:    General: Skin is warm and dry.     Capillary Refill: Capillary refill takes less than 2 seconds.     Nails: There is no clubbing.  Neurological:     General: No focal deficit present.     Mental Status: She is alert and oriented to person, place, and time.     GCS: GCS eye subscore is 4. GCS verbal subscore is 5. GCS motor subscore is 6.     Sensory: Sensation is intact.     Motor: Motor function is intact.     Coordination: Coordination is intact.     Gait: Gait is intact.     Deep Tendon Reflexes: Reflexes are normal and symmetric.  Psychiatric:        Attention and Perception: Attention normal.        Mood and Affect: Mood normal.        Speech: Speech normal.        Behavior: Behavior normal. Behavior is cooperative.        Thought Content: Thought content normal.        Cognition and Memory: Cognition and memory normal.        Judgment: Judgment normal.  Results for orders placed or performed in visit on 04/16/21  Hemoglobin A1c  Result Value Ref Range   Hgb A1c MFr Bld 6.0 (H) 4.8 - 5.6 %   Est. average glucose Bld gHb Est-mCnc 126 mg/dL  Thyroid Panel With TSH  Result Value Ref Range   TSH 1.680 0.450 - 4.500 uIU/mL   T4, Total 6.8 4.5 - 12.0 ug/dL   T3 Uptake Ratio 25 24 - 39 %   Free Thyroxine Index 1.7 1.2 - 4.9  VITAMIN D 25 Hydroxy (Vit-D Deficiency, Fractures)  Result Value Ref Range   Vit D, 25-Hydroxy 34.2 30.0 - 100.0 ng/mL  CBC with Differential/Platelet  Result Value Ref Range   WBC 7.6 3.4 - 10.8 x10E3/uL   RBC 4.45 3.77 - 5.28 x10E6/uL   Hemoglobin 12.6 11.1 - 15.9 g/dL   Hematocrit 37.9 34.0 - 46.6 %   MCV 85 79 - 97 fL   MCH 28.3 26.6 - 33.0 pg   MCHC 33.2 31.5 - 35.7 g/dL   RDW 14.3 11.7 -  15.4 %   Platelets 351 150 - 450 x10E3/uL   Neutrophils 57 Not Estab. %   Lymphs 30 Not Estab. %   Monocytes 8 Not Estab. %   Eos 3 Not Estab. %   Basos 2 Not Estab. %   Neutrophils Absolute 4.4 1.4 - 7.0 x10E3/uL   Lymphocytes Absolute 2.3 0.7 - 3.1 x10E3/uL   Monocytes Absolute 0.6 0.1 - 0.9 x10E3/uL   EOS (ABSOLUTE) 0.2 0.0 - 0.4 x10E3/uL   Basophils Absolute 0.1 0.0 - 0.2 x10E3/uL   Immature Granulocytes 0 Not Estab. %   Immature Grans (Abs) 0.0 0.0 - 0.1 x10E3/uL  Comprehensive metabolic panel  Result Value Ref Range   Glucose 105 (H) 70 - 99 mg/dL   BUN 14 8 - 27 mg/dL   Creatinine, Ser 0.83 0.57 - 1.00 mg/dL   eGFR 81 >59 mL/min/1.73   BUN/Creatinine Ratio 17 12 - 28   Sodium 141 134 - 144 mmol/L   Potassium 4.5 3.5 - 5.2 mmol/L   Chloride 101 96 - 106 mmol/L   CO2 27 20 - 29 mmol/L   Calcium 9.5 8.7 - 10.3 mg/dL   Total Protein 7.5 6.0 - 8.5 g/dL   Albumin 4.6 3.8 - 4.9 g/dL   Globulin, Total 2.9 1.5 - 4.5 g/dL   Albumin/Globulin Ratio 1.6 1.2 - 2.2   Bilirubin Total <0.2 0.0 - 1.2 mg/dL   Alkaline Phosphatase 115 44 - 121 IU/L   AST 19 0 - 40 IU/L   ALT 14 0 - 32 IU/L      Assessment & Plan:   Problem List Items Addressed This Visit     Pre-diabetes    Will obtain labs in the near future to re-evaluate the effectiveness of medication and dietary/exercise changes. Labs placed for Feb.  Doing fantastic based on weight loss.  Continue medications.  Will f/u in 6 months to evaluate further      Relevant Medications   Semaglutide, 1 MG/DOSE, 4 MG/3ML SOPN   Other Relevant Orders   CBC with Differential/Platelet   Comprehensive metabolic panel   Hemoglobin A1c   Other Visit Diagnoses     Encounter for annual physical exam    -  Primary   Hypertension, unspecified type       Relevant Medications   Semaglutide, 1 MG/DOSE, 4 MG/3ML SOPN   Other Relevant Orders   CBC with Differential/Platelet   Comprehensive metabolic panel  Hemoglobin A1c   Mixed  hyperlipidemia       Relevant Medications   Semaglutide, 1 MG/DOSE, 4 MG/3ML SOPN   Other Relevant Orders   CBC with Differential/Platelet   Comprehensive metabolic panel   Hemoglobin A1c        Follow up plan: Return in about 6 months (around 12/23/2021) for Weight Loss- A1c check-- February lab appt only.   LABORATORY TESTING:  - Pap smear:  through OB GYN - STI testing: deferred  IMMUNIZATIONS:   - Tdap: Tetanus vaccination status reviewed: last tetanus booster within 10 years. - Influenza: Up to date - Pneumovax: Not applicable - Prevnar: Not applicable - HPV: Not applicable - Zostavax vaccine: Up to date  SCREENING: -Mammogram: Up to date  - Colonoscopy: Up to date  - Bone Density: Not applicable  -Hearing Test: Not applicable  -Spirometry: Not applicable   PATIENT COUNSELING:   For all adult patients, I recommend   A well balanced diet low in saturated fats, cholesterol, and moderation in carbohydrates.   This can be as simple as monitoring portion sizes and cutting back on sugary beverages such as soda and  juice to start with.    Daily water consumption of at least 64 ounces.  Physical activity at least 180 minutes per week, if just starting out.   This can be as simple as taking the stairs instead of the elevator and walking 2-3 laps around the office  purposefully every day.   STD protection, partner selection, and regular testing if high risk.  Limited consumption of alcoholic beverages if alcohol is consumed.  For women, I recommend no more than 7 alcoholic beverages per week, spread out throughout the week.  Avoid "binge" drinking or consuming large quantities of alcohol in one setting.   Please let me know if you feel you may need help with reduction or quitting alcohol consumption.   Avoidance of nicotine, if used.  Please let me know if you feel you may need help with reduction or quitting nicotine use.   Daily mental health attention.  This can  be in the form of 5 minute daily meditation, prayer, journaling, yoga, reflection, etc.   Purposeful attention to your emotions and mental state can significantly improve your overall wellbeing  and  Health.  Please know that I am here to help you with all of your health care goals and am happy to work with you to find a solution that works best for you.  The greatest advice I have received with any changes in life are to take it one step at a time, that even means if all you can focus on is the next 60 seconds, then do that and celebrate your victories.  With any changes in life, you will have set backs, and that is OK. The important thing to remember is, if you have a set back, it is not a failure, it is an opportunity to try again!  Health Maintenance Recommendations Screening Testing Mammogram Every 1 -2 years based on history and risk factors Starting at age 81 Pap Smear Ages 21-39 every 3 years Ages 87-65 every 5 years with HPV testing More frequent testing may be required based on results and history Colon Cancer Screening Every 1-10 years based on test performed, risk factors, and history Starting at age 34 Bone Density Screening Every 2-10 years based on history Starting at age 44 for women Recommendations for men differ based on medication usage, history, and risk factors  AAA Screening One time ultrasound Men 27-40 years old who have every smoked Lung Cancer Screening Low Dose Lung CT every 12 months Age 61-80 years with a 30 pack-year smoking history who still smoke or who have quit within the last 15 years  Screening Labs Routine  Labs: Complete Blood Count (CBC), Complete Metabolic Panel (CMP), Cholesterol (Lipid Panel) Every 6-12 months based on history and medications May be recommended more frequently based on current conditions or previous results Hemoglobin A1c Lab Every 3-12 months based on history and previous results Starting at age 57 or earlier with  diagnosis of diabetes, high cholesterol, BMI >26, and/or risk factors Frequent monitoring for patients with diabetes to ensure blood sugar control Thyroid Panel (TSH w/ T3 & T4) Every 6 months based on history, symptoms, and risk factors May be repeated more often if on medication HIV One time testing for all patients 32 and older May be repeated more frequently for patients with increased risk factors or exposure Hepatitis C One time testing for all patients 53 and older May be repeated more frequently for patients with increased risk factors or exposure Gonorrhea, Chlamydia Every 12 months for all sexually active persons 13-24 years Additional monitoring may be recommended for those who are considered high risk or who have symptoms PSA Men 11-35 years old with risk factors Additional screening may be recommended from age 110-69 based on risk factors, symptoms, and history  Vaccine Recommendations Tetanus Booster All adults every 10 years Flu Vaccine All patients 6 months and older every year COVID Vaccine All patients 12 years and older Initial dosing with booster May recommend additional booster based on age and health history HPV Vaccine 2 doses all patients age 63-26 Dosing may be considered for patients over 26 Shingles Vaccine (Shingrix) 2 doses all adults 9 years and older Pneumonia (Pneumovax 23) All adults 56 years and older May recommend earlier dosing based on health history Pneumonia (Prevnar 27) All adults 33 years and older Dosed 1 year after Pneumovax 23  Additional Screening, Testing, and Vaccinations may be recommended on an individualized basis based on family history, health history, risk factors, and/or exposure.      NEXT PREVENTATIVE PHYSICAL DUE IN 1 YEAR. Return in about 6 months (around 12/23/2021) for Weight Loss- A1c check-- February lab appt only.

## 2021-06-26 NOTE — Assessment & Plan Note (Signed)
Will obtain labs in the near future to re-evaluate the effectiveness of medication and dietary/exercise changes. Labs placed for Feb.  Doing fantastic based on weight loss.  Continue medications.  Will f/u in 6 months to evaluate further

## 2021-07-24 ENCOUNTER — Other Ambulatory Visit (HOSPITAL_BASED_OUTPATIENT_CLINIC_OR_DEPARTMENT_OTHER): Payer: Self-pay | Admitting: Nurse Practitioner

## 2021-07-24 ENCOUNTER — Other Ambulatory Visit: Payer: Self-pay

## 2021-07-24 ENCOUNTER — Ambulatory Visit (INDEPENDENT_AMBULATORY_CARE_PROVIDER_SITE_OTHER): Payer: 59 | Admitting: Nurse Practitioner

## 2021-07-24 DIAGNOSIS — R7303 Prediabetes: Secondary | ICD-10-CM

## 2021-07-24 DIAGNOSIS — I10 Essential (primary) hypertension: Secondary | ICD-10-CM

## 2021-07-24 DIAGNOSIS — E782 Mixed hyperlipidemia: Secondary | ICD-10-CM

## 2021-07-24 NOTE — Progress Notes (Signed)
° °  Established Patient Nurse Visit ° °------------------------------------------------------------------------------------------ ° °Patient presents in office today for labs °Lab requisition printed and given to [facility] Phlebotomist °Patient labs drawn by [facility] Phlebotomist ° ° °

## 2021-07-25 LAB — CBC WITH DIFFERENTIAL/PLATELET
Basophils Absolute: 0.1 10*3/uL (ref 0.0–0.2)
Basos: 2 %
EOS (ABSOLUTE): 0.2 10*3/uL (ref 0.0–0.4)
Eos: 3 %
Hematocrit: 38.7 % (ref 34.0–46.6)
Hemoglobin: 12.3 g/dL (ref 11.1–15.9)
Immature Grans (Abs): 0 10*3/uL (ref 0.0–0.1)
Immature Granulocytes: 0 %
Lymphocytes Absolute: 1.9 10*3/uL (ref 0.7–3.1)
Lymphs: 29 %
MCH: 27.5 pg (ref 26.6–33.0)
MCHC: 31.8 g/dL (ref 31.5–35.7)
MCV: 87 fL (ref 79–97)
Monocytes Absolute: 0.6 10*3/uL (ref 0.1–0.9)
Monocytes: 9 %
Neutrophils Absolute: 3.8 10*3/uL (ref 1.4–7.0)
Neutrophils: 57 %
Platelets: 346 10*3/uL (ref 150–450)
RBC: 4.47 x10E6/uL (ref 3.77–5.28)
RDW: 14.7 % (ref 11.7–15.4)
WBC: 6.6 10*3/uL (ref 3.4–10.8)

## 2021-07-25 LAB — COMPREHENSIVE METABOLIC PANEL
ALT: 12 IU/L (ref 0–32)
AST: 16 IU/L (ref 0–40)
Albumin/Globulin Ratio: 1.4 (ref 1.2–2.2)
Albumin: 4.2 g/dL (ref 3.8–4.9)
Alkaline Phosphatase: 89 IU/L (ref 44–121)
BUN/Creatinine Ratio: 15 (ref 12–28)
BUN: 12 mg/dL (ref 8–27)
Bilirubin Total: 0.4 mg/dL (ref 0.0–1.2)
CO2: 23 mmol/L (ref 20–29)
Calcium: 9.9 mg/dL (ref 8.7–10.3)
Chloride: 102 mmol/L (ref 96–106)
Creatinine, Ser: 0.8 mg/dL (ref 0.57–1.00)
Globulin, Total: 2.9 g/dL (ref 1.5–4.5)
Glucose: 88 mg/dL (ref 70–99)
Potassium: 4.9 mmol/L (ref 3.5–5.2)
Sodium: 140 mmol/L (ref 134–144)
Total Protein: 7.1 g/dL (ref 6.0–8.5)
eGFR: 84 mL/min/{1.73_m2} (ref >59)

## 2021-07-25 LAB — HEMOGLOBIN A1C
Est. average glucose Bld gHb Est-mCnc: 123 mg/dL
Hgb A1c MFr Bld: 5.9 % — ABNORMAL HIGH (ref 4.8–5.6)

## 2021-10-29 LAB — HM PAP SMEAR: HPV, high-risk: NEGATIVE

## 2021-12-23 ENCOUNTER — Ambulatory Visit (HOSPITAL_BASED_OUTPATIENT_CLINIC_OR_DEPARTMENT_OTHER): Payer: 59 | Admitting: Nurse Practitioner

## 2021-12-25 ENCOUNTER — Encounter (HOSPITAL_BASED_OUTPATIENT_CLINIC_OR_DEPARTMENT_OTHER): Payer: Self-pay | Admitting: Nurse Practitioner

## 2021-12-25 ENCOUNTER — Ambulatory Visit (HOSPITAL_BASED_OUTPATIENT_CLINIC_OR_DEPARTMENT_OTHER): Payer: 59 | Admitting: Nurse Practitioner

## 2021-12-25 VITALS — BP 113/66 | HR 52 | Ht 69.0 in | Wt 193.2 lb

## 2021-12-25 DIAGNOSIS — I10 Essential (primary) hypertension: Secondary | ICD-10-CM | POA: Diagnosis not present

## 2021-12-25 DIAGNOSIS — R7303 Prediabetes: Secondary | ICD-10-CM | POA: Diagnosis not present

## 2021-12-25 DIAGNOSIS — E782 Mixed hyperlipidemia: Secondary | ICD-10-CM | POA: Diagnosis not present

## 2021-12-25 DIAGNOSIS — L109 Pemphigus, unspecified: Secondary | ICD-10-CM

## 2021-12-25 DIAGNOSIS — Z6837 Body mass index (BMI) 37.0-37.9, adult: Secondary | ICD-10-CM

## 2021-12-25 MED ORDER — CLOBETASOL PROPIONATE 0.05 % EX CREA: TOPICAL_CREAM | CUTANEOUS | 3 refills | Status: DC

## 2021-12-25 MED ORDER — OZEMPIC (2 MG/DOSE) 8 MG/3ML ~~LOC~~ SOPN: 2.0000 mg | PEN_INJECTOR | SUBCUTANEOUS | 3 refills | Status: DC

## 2021-12-25 MED ORDER — PEN NEEDLES 33G X 4 MM MISC: 1.0000 [IU] | 0 refills | Status: AC

## 2021-12-25 NOTE — Progress Notes (Signed)
Shawna Clamp, DNP, AGNP-c [facility] [address] [place], [place] [address] [phone] Office [phone] Fax  ESTABLISHED PATIENT- Chronic Health and/or Follow-Up Visit  Blood pressure 113/66, pulse (!) 52, height 5\' 9"  (1.753 m), weight 193 lb 3.2 oz (87.6 kg), last menstrual period 11/19/2008, SpO2 98 %.  Follow-up (Pt here for f/u on weight loss)   HPI  Margaret Ramirez  is a 61 y.o. year old female presenting today for evaluation and management of the following: Weight Management Working very hard to make changes to diet No sugar, no "white foods", very little red meat, more fish/salmon Works out at 77 routinely [employer] to make changes Cholesterol was a little high on last check, but anticipates this will be better with diet changes Tolerating medication well without significant side effects.  Notes decreased appetite  ROS All ROS negative with exception of what is listed in HPI  PHYSICAL EXAM Physical Exam Vitals and nursing note reviewed.  Constitutional:      General: She is not in acute distress.    Appearance: Normal appearance.  HENT:     Head: Normocephalic.  Eyes:     Extraocular Movements: Extraocular movements intact.     Conjunctiva/sclera: Conjunctivae normal.     Pupils: Pupils are equal, round, and reactive to light.  Neck:     Vascular: No carotid bruit.  Cardiovascular:     Rate and Rhythm: Normal rate and regular rhythm.     Pulses: Normal pulses.     Heart sounds: Normal heart sounds. No murmur heard. Pulmonary:     Effort: Pulmonary effort is normal.     Breath sounds: Normal breath sounds. No wheezing.  Abdominal:     General: Bowel sounds are normal. There is no distension.     Palpations: Abdomen is soft.     Tenderness: There is no abdominal tenderness. There is no guarding.  Musculoskeletal:        General: Normal range of motion.     Cervical back: Normal range of motion and  neck supple.     Right lower leg: No edema.     Left lower leg: No edema.  Lymphadenopathy:     Cervical: No cervical adenopathy.  Skin:    General: Skin is warm and dry.     Capillary Refill: Capillary refill takes less than 2 seconds.  Neurological:     General: No focal deficit present.     Mental Status: She is alert and oriented to person, place, and time.  Psychiatric:        Mood and Affect: Mood normal.        Behavior: Behavior normal.        Thought Content: Thought content normal.        Judgment: Judgment normal.     ASSESSMENT & PLAN Problem List Items Addressed This Visit     Class 2 severe obesity with serious comorbidity and body mass index (BMI) of 37.0 to 37.9 in adult [facility]) - Primary    Doing very well with exercise and diet to help reduce weight and manage co-morbidities. Recommend continued current therapy. Will plan to recheck in labs today.       Relevant Medications   Semaglutide, 2 MG/DOSE, (OZEMPIC, 2 MG/DOSE,) 8 MG/3ML SOPN   Pemphigus    Refill on clobetasol provided today. No alarm sx present.       Relevant Medications   clobetasol cream (TEMOVATE) 0.05 %  Pre-diabetes    Repeat labs today. Continue current diet and exercise modifications and medications.       Relevant Medications   Semaglutide, 2 MG/DOSE, (OZEMPIC, 2 MG/DOSE,) 8 MG/3ML SOPN   Insulin Pen Needle (PEN NEEDLES) 33G X 4 MM MISC   Other Relevant Orders   Comprehensive metabolic panel (Completed)   Lipid panel (Completed)   Hemoglobin A1c (Completed)   Mixed hyperlipidemia    Chronic. Suspect improvement with recent weight loss and improvement of diet and exercise. Will monitor.       Relevant Medications   Semaglutide, 2 MG/DOSE, (OZEMPIC, 2 MG/DOSE,) 8 MG/3ML SOPN   Insulin Pen Needle (PEN NEEDLES) 33G X 4 MM MISC   Other Relevant Orders   Comprehensive metabolic panel (Completed)   Lipid panel (Completed)   Hemoglobin A1c (Completed)   Hypertension    BP well  controlled with no alarm sx present. Weight management, diet, and exercise positive impact on BP management. Continue with current therapies.       Relevant Medications   Semaglutide, 2 MG/DOSE, (OZEMPIC, 2 MG/DOSE,) 8 MG/3ML SOPN   Insulin Pen Needle (PEN NEEDLES) 33G X 4 MM MISC   Other Relevant Orders   Comprehensive metabolic panel (Completed)   Lipid panel (Completed)   Hemoglobin A1c (Completed)     FOLLOW-UP Return in about 6 months (around 06/27/2022) for labs only- CBC, CMP, Lipid, A1c.   Shawna Clamp, DNP, AGNP-c 12/25/2021 11:08 AM

## 2021-12-25 NOTE — Patient Instructions (Addendum)
I am so so proud of you!! You are really doing an amazing job and your hard work is really paying off.   Keep up the great work! I have sent in the increased dose of medication for you to the pharmacy with a year of refills.

## 2021-12-31 ENCOUNTER — Telehealth (HOSPITAL_BASED_OUTPATIENT_CLINIC_OR_DEPARTMENT_OTHER): Payer: Self-pay

## 2021-12-31 NOTE — Telephone Encounter (Signed)
Pt was called and I left voicemail. Due to labs not being collected by the right tubes. I left an vm asking if she could come back in to [facility] to do them over if possible.

## 2022-01-02 LAB — LIPID PANEL
Chol/HDL Ratio: 3.9 ratio (ref 0.0–4.4)
Cholesterol, Total: 225 mg/dL — ABNORMAL HIGH (ref 100–199)
HDL: 58 mg/dL (ref >39)
LDL Chol Calc (NIH): 156 mg/dL — ABNORMAL HIGH (ref 0–99)
Triglycerides: 63 mg/dL (ref 0–149)
VLDL Cholesterol Cal: 11 mg/dL (ref 5–40)

## 2022-01-02 LAB — COMPREHENSIVE METABOLIC PANEL
ALT: 9 IU/L (ref 0–32)
AST: 11 IU/L (ref 0–40)
Albumin/Globulin Ratio: 1.5 (ref 1.2–2.2)
Albumin: 4.2 g/dL (ref 3.9–4.9)
Alkaline Phosphatase: 84 IU/L (ref 44–121)
BUN/Creatinine Ratio: 23 (ref 12–28)
BUN: 15 mg/dL (ref 8–27)
Bilirubin Total: 0.4 mg/dL (ref 0.0–1.2)
CO2: 23 mmol/L (ref 20–29)
Calcium: 9.7 mg/dL (ref 8.7–10.3)
Chloride: 104 mmol/L (ref 96–106)
Creatinine, Ser: 0.65 mg/dL (ref 0.57–1.00)
Globulin, Total: 2.8 g/dL (ref 1.5–4.5)
Glucose: 85 mg/dL (ref 70–99)
Potassium: 3.9 mmol/L (ref 3.5–5.2)
Sodium: 142 mmol/L (ref 134–144)
Total Protein: 7 g/dL (ref 6.0–8.5)
eGFR: 100 mL/min/{1.73_m2} (ref >59)

## 2022-01-02 LAB — HEMOGLOBIN A1C
Est. average glucose Bld gHb Est-mCnc: 120 mg/dL
Hgb A1c MFr Bld: 5.8 % — ABNORMAL HIGH (ref 4.8–5.6)

## 2022-02-02 DIAGNOSIS — E782 Mixed hyperlipidemia: Secondary | ICD-10-CM | POA: Insufficient documentation

## 2022-02-02 DIAGNOSIS — I10 Essential (primary) hypertension: Secondary | ICD-10-CM | POA: Insufficient documentation

## 2022-02-02 NOTE — Assessment & Plan Note (Signed)
BP well controlled with no alarm sx present. Weight management, diet, and exercise positive impact on BP management. Continue with current therapies.

## 2022-02-02 NOTE — Assessment & Plan Note (Signed)
Refill on clobetasol provided today. No alarm sx present.

## 2022-02-02 NOTE — Assessment & Plan Note (Signed)
Chronic. Suspect improvement with recent weight loss and improvement of diet and exercise. Will monitor.

## 2022-02-02 NOTE — Assessment & Plan Note (Signed)
Doing very well with exercise and diet to help reduce weight and manage co-morbidities. Recommend continued current therapy. Will plan to recheck in labs today.

## 2022-02-02 NOTE — Assessment & Plan Note (Signed)
Repeat labs today. Continue current diet and exercise modifications and medications.

## 2022-06-20 ENCOUNTER — Other Ambulatory Visit: Payer: Self-pay | Admitting: Nurse Practitioner

## 2022-06-20 ENCOUNTER — Other Ambulatory Visit: Payer: 59

## 2022-06-20 DIAGNOSIS — E782 Mixed hyperlipidemia: Secondary | ICD-10-CM

## 2022-06-20 DIAGNOSIS — I10 Essential (primary) hypertension: Secondary | ICD-10-CM

## 2022-06-20 DIAGNOSIS — R7303 Prediabetes: Secondary | ICD-10-CM

## 2022-06-21 LAB — COMPREHENSIVE METABOLIC PANEL
ALT: 15 IU/L (ref 0–32)
AST: 18 IU/L (ref 0–40)
Albumin/Globulin Ratio: 1.6 (ref 1.2–2.2)
Albumin: 4.4 g/dL (ref 3.9–4.9)
Alkaline Phosphatase: 74 IU/L (ref 44–121)
BUN/Creatinine Ratio: 18 (ref 12–28)
BUN: 12 mg/dL (ref 8–27)
Bilirubin Total: 0.3 mg/dL (ref 0.0–1.2)
CO2: 23 mmol/L (ref 20–29)
Calcium: 9.6 mg/dL (ref 8.7–10.3)
Chloride: 102 mmol/L (ref 96–106)
Creatinine, Ser: 0.68 mg/dL (ref 0.57–1.00)
Globulin, Total: 2.7 g/dL (ref 1.5–4.5)
Glucose: 85 mg/dL (ref 70–99)
Potassium: 4.5 mmol/L (ref 3.5–5.2)
Sodium: 141 mmol/L (ref 134–144)
Total Protein: 7.1 g/dL (ref 6.0–8.5)
eGFR: 99 mL/min/{1.73_m2} (ref >59)

## 2022-06-21 LAB — CBC WITH DIFFERENTIAL/PLATELET
Basophils Absolute: 0.1 10*3/uL (ref 0.0–0.2)
Basos: 2 %
EOS (ABSOLUTE): 0.2 10*3/uL (ref 0.0–0.4)
Eos: 4 %
Hematocrit: 35.9 % (ref 34.0–46.6)
Hemoglobin: 12 g/dL (ref 11.1–15.9)
Immature Grans (Abs): 0 10*3/uL (ref 0.0–0.1)
Immature Granulocytes: 0 %
Lymphocytes Absolute: 1.9 10*3/uL (ref 0.7–3.1)
Lymphs: 40 %
MCH: 29 pg (ref 26.6–33.0)
MCHC: 33.4 g/dL (ref 31.5–35.7)
MCV: 87 fL (ref 79–97)
Monocytes Absolute: 0.4 10*3/uL (ref 0.1–0.9)
Monocytes: 8 %
Neutrophils Absolute: 2.1 10*3/uL (ref 1.4–7.0)
Neutrophils: 46 %
Platelets: 307 10*3/uL (ref 150–450)
RBC: 4.14 x10E6/uL (ref 3.77–5.28)
RDW: 13.9 % (ref 11.7–15.4)
WBC: 4.7 10*3/uL (ref 3.4–10.8)

## 2022-06-21 LAB — LIPID PANEL
Chol/HDL Ratio: 3.9 ratio (ref 0.0–4.4)
Cholesterol, Total: 256 mg/dL — ABNORMAL HIGH (ref 100–199)
HDL: 66 mg/dL
LDL Chol Calc (NIH): 181 mg/dL — ABNORMAL HIGH (ref 0–99)
Triglycerides: 58 mg/dL (ref 0–149)
VLDL Cholesterol Cal: 9 mg/dL (ref 5–40)

## 2022-06-21 LAB — HEMOGLOBIN A1C
Est. average glucose Bld gHb Est-mCnc: 111 mg/dL
Hgb A1c MFr Bld: 5.5 % (ref 4.8–5.6)

## 2022-06-27 ENCOUNTER — Ambulatory Visit (HOSPITAL_BASED_OUTPATIENT_CLINIC_OR_DEPARTMENT_OTHER): Payer: 59 | Admitting: Nurse Practitioner

## 2022-06-27 ENCOUNTER — Ambulatory Visit: Payer: 59 | Admitting: Nurse Practitioner

## 2022-06-27 ENCOUNTER — Encounter: Payer: Self-pay | Admitting: Nurse Practitioner

## 2022-06-27 VITALS — BP 124/80 | HR 64 | Temp 98.1°F | Ht 69.0 in | Wt 172.4 lb

## 2022-06-27 DIAGNOSIS — R7303 Prediabetes: Secondary | ICD-10-CM | POA: Diagnosis not present

## 2022-06-27 DIAGNOSIS — Z23 Encounter for immunization: Secondary | ICD-10-CM | POA: Diagnosis not present

## 2022-06-27 DIAGNOSIS — E782 Mixed hyperlipidemia: Secondary | ICD-10-CM | POA: Diagnosis not present

## 2022-06-27 DIAGNOSIS — N950 Postmenopausal bleeding: Secondary | ICD-10-CM | POA: Insufficient documentation

## 2022-06-27 DIAGNOSIS — G4700 Insomnia, unspecified: Secondary | ICD-10-CM

## 2022-06-27 DIAGNOSIS — D509 Iron deficiency anemia, unspecified: Secondary | ICD-10-CM | POA: Insufficient documentation

## 2022-06-27 DIAGNOSIS — Z6837 Body mass index (BMI) 37.0-37.9, adult: Secondary | ICD-10-CM

## 2022-06-27 DIAGNOSIS — I10 Essential (primary) hypertension: Secondary | ICD-10-CM | POA: Diagnosis not present

## 2022-06-27 DIAGNOSIS — L109 Pemphigus, unspecified: Secondary | ICD-10-CM

## 2022-06-27 DIAGNOSIS — M159 Polyosteoarthritis, unspecified: Secondary | ICD-10-CM

## 2022-06-27 HISTORY — DX: Postmenopausal bleeding: N95.0

## 2022-06-27 MED ORDER — CLOBETASOL PROPIONATE 0.05 % EX CREA: TOPICAL_CREAM | CUTANEOUS | 3 refills | Status: DC

## 2022-06-27 MED ORDER — OMEGA-3-ACID ETHYL ESTERS 1 G PO CAPS: 1.0000 g | ORAL_CAPSULE | Freq: Two times a day (BID) | ORAL | 1 refills | Status: DC

## 2022-06-27 MED ORDER — DICLOFENAC SODIUM 1 % EX GEL: 2.0000 g | Freq: Four times a day (QID) | CUTANEOUS | 3 refills | Status: AC

## 2022-06-27 MED ORDER — HYDROXYZINE HCL 25 MG PO TABS: 25.0000 mg | ORAL_TABLET | Freq: Every evening | ORAL | 3 refills | Status: DC | PRN

## 2022-06-27 NOTE — Patient Instructions (Addendum)
I have included exercises for your shoulder to try. If this does not get better in the next 4 weeks, let me know. You can try ibuprofen and ice/heat to see if this helps, as well.   WEIGHT LOSS PLANNING Your progress today shows:     06/27/2022    8:08 AM 12/25/2021   11:06 AM 06/25/2021   10:34 AM  Vitals with BMI  Height 5\' 9"  5\' 9"    Weight 172 lbs 6 oz 193 lbs 3 oz 223 lbs 13 oz  BMI 56.21 30.86   Systolic [phone]  Diastolic 80 66 73  Pulse 64 52 75    For best management of weight, it is vital to balance intake versus output. This means the number of calories burned per day must be less than the calories you take in with food and drink.   I recommend trying to follow a diet with the following: Calories: 1200-1500 calories per day Carbohydrates: 150-180 grams of carbohydrates per day  Why: Gives your body enough "quick fuel" for cells to maintain normal function without sending them into starvation mode.  Protein: At least 45-55 grams of protein per day Why: Protein takes longer and uses more energy than carbohydrates to break down for fuel. The carbohydrates in your meals serves as quick energy sources and proteins help use some of that extra quick energy to break down to produce long term energy. This helps you not feel hungry as quickly and protein breakdown burns calories.  Water: Drink AT LEAST 64 ounces of water per day  Why: Water is essential to healthy metabolism. Water helps to fill the stomach and keep you fuller longer. Water is required for healthy digestion and filtering of waste in the body.  Fat: Limit fats in your diet- when choosing fats, choose foods with lower fats content such as lean meats (chicken, fish, [place]).  Why: Increased fat intake leads to storage "for later". Once you burn your carbohydrate energy, your body goes into fat and protein breakdown mode to help you loose weight.  Cholesterol: Fats and oils that are LIQUID at room temperature are best.  Choose vegetable oils (olive oil, avocado oil, nuts). Avoid fats that are SOLID at room temperature (animal fats, processed meats). Healthy fats are often found in whole grains, beans, nuts, seeds, and berries.  Why: Elevated cholesterol levels lead to build up of cholesterol on the inside of your blood vessels. This will eventually cause the blood vessels to become hard and can lead to high blood pressure and damage to your organs. When the blood flow is reduced, but the pressure is high from cholesterol buildup, parts of the cholesterol can break off and form clots that can go to the brain or heart leading to a stroke or heart attack.  Fiber: Increase amount of SOLUBLE the fiber in your diet. This helps to fill you up, lowers cholesterol, and helps with digestion. Some foods high in soluble fiber are oats, peas, beans, apples, carrots, barley, and citrus fruits.   Why: Fiber fills you up, helps remove excess cholesterol, and aids in healthy digestion which are all very important in weight management.   I recommend the following as a minimum activity routine: Purposeful walk or other physical activity at least 20 minutes every single day. This means purposefully taking a walk, jog, bike, swim, treadmill, elliptical, dance, etc.  This activity should be ABOVE your normal daily activities, such as walking at work. Goal exercise should be at  least 150 minutes a week- work your way up to this.   Heart Rate: Your maximum exercise heart rate should be 220 - Your Age in Years. When exercising, get your heart rate up, but avoid going over the maximum targeted heart rate.  60-70% of your maximum heart rate is where you tend to burn the most fat. To find this number:  220 - Age In Years= Max HR  Max HR x 0.6 (or 0.7) = Fat Burning HR The Fat Burning HR is your goal heart rate while working out to burn the most fat.  NEVER exercise to the point your feel lightheaded, weak, nauseated, dizzy. If you experience  ANY of these symptoms- STOP exercise! Allow yourself to cool down and your heart rate to come down. Then restart slower next time.  If at [place] you feel chest pain or chest pressure during exercise, STOP IMMEDIATELY and seek medical attention.

## 2022-06-27 NOTE — Progress Notes (Signed)
Worthy Keeler, DNP, AGNP-c [facility]  [address] [place], [place] [address] [place] and/or Follow-Up Visit  Blood pressure 124/80, pulse 64, temperature 98.1 F (36.7 C), height 5\' 9"  (1.753 m), weight 172 lb 6.4 oz (78.2 kg), last menstrual period 11/19/2008.    Margaret Ramirez is a 62 y.o. year old female presenting today for evaluation and management of the following:  PreDM/HLD/Elevated BMI/HTN Sh[place]e tells me she is feeling very good overall and doing well with her current medications. She has not had any CP, LE edema, shob, palpitations, increased thirst or urination. She is taking her medications as prescribed and has not had any significant side effects. She is no longer on BP medication as this has improved with weight management. She is taking omega 3 for lipid control.   Health Maintenance Pap done last year and mammogram at the same time. Usually in April - [facility] (Dr. Mancel Bale) Colonoscopy - Dr. Collene Mares not due until 70 years All ROS negative with exception of what is listed above.   PHYSICAL EXAM Physical Exam Vitals and nursing note reviewed.  Constitutional:      General: She is not in acute distress.    Appearance: Normal appearance.  HENT:     Head: Normocephalic.  Eyes:     Extraocular Movements: Extraocular movements intact.     Conjunctiva/sclera: Conjunctivae normal.     Pupils: Pupils are equal, round, and reactive to light.  Neck:     Vascular: No carotid bruit.  Cardiovascular:     Rate and Rhythm: Normal rate and regular rhythm.     Pulses: Normal pulses.     Heart sounds: Normal heart sounds. No murmur heard. Pulmonary:     Effort: Pulmonary effort is normal.     Breath sounds: Normal breath sounds. No wheezing.  Abdominal:     General: Bowel sounds are normal. There is no distension.     Palpations: Abdomen is soft.     Tenderness: There is no abdominal  tenderness. There is no guarding.  Musculoskeletal:        General: Tenderness present.     Right shoulder: Normal.     Left shoulder: Tenderness present. Decreased range of motion.     Cervical back: Normal range of motion and neck supple.     Right lower leg: No edema.     Left lower leg: No edema.     Comments: Multiple joint tenderness present due to osteoarthritic condition.   Lymphadenopathy:     Cervical: No cervical adenopathy.  Skin:    General: Skin is warm and dry.     Capillary Refill: Capillary refill takes less than 2 seconds.  Neurological:     General: No focal deficit present.     Mental Status: She is alert and oriented to person, place, and time.  Psychiatric:        Mood and Affect: Mood normal.        Behavior: Behavior normal.        Thought Content: Thought content normal.        Judgment: Judgment normal.     PLAN Problem List Items Addressed This Visit     Class 2 severe obesity with serious comorbidity and body mass index (BMI) of 37.0 to 37.9 in adult ([place]) - Primary    BMI down to 25.46 today. 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 There are  no concerning symptoms present. She is following a healthy diet and exercise plan. I recommend that she continue at this time. Will obtain labs for evaluation.       Relevant Medications   omega-3 acid ethyl esters (LOVAZA) 1 g capsule   Pemphigus    Refill provided on clobetasol- this is well controlled with PRN treatment. No alarm symptoms.       Relevant Medications   clobetasol cream (TEMOVATE) 0.05 %   Osteoarthritis involving multiple joints on both sides of body    Chronic. She is having pain in some of her joints at this time. I recommend voltaren gel as opposed to oral NSAIDs to avoid stomach upset. Routine exercise can be helpful to keep the joints loose and moving. If this becomes worse, she will let me know. No warmth or swelling present at this time.        Pre-diabetes    Chronic. I suspect that her A1c will have come down now that she is on semaglutide and has lost a considerable amount of weight. Recommend continue on semaglutide at this time and we will monitor labs today. No concerning side effects from medication noted. Diet and exercise reviewed.       Relevant Medications   omega-3 acid ethyl esters (LOVAZA) 1 g capsule   Mixed hyperlipidemia    Chronic. Currently on omega 3. She has lost a considerable amount of weight, so I suspect that this will show improvement as well. We will monitor labs.       Relevant Medications   omega-3 acid ethyl esters (LOVAZA) 1 g capsule   Hypertension    Well controlled on no medication at this time. Weight management seems to have significantly helped her blood pressures, this is great! We will continue to monitor closely. Diet and exercise recommendations reviewed. We will obtain labs today. No changes at this time.       Relevant Medications   omega-3 acid ethyl esters (LOVAZA) 1 g capsule   Other Visit Diagnoses     Influenza vaccine needed       Relevant Orders   Flu Vaccine QUAD 6+ mos PF IM (Fluarix Quad PF) (Completed)   Insomnia, unspecified type       Relevant Medications   hydrOXYzine (ATARAX) 25 MG tablet   Primary osteoarthritis involving multiple joints       Relevant Medications   diclofenac Sodium (VOLTAREN) 1 % GEL       Return in about 6 months (around 12/26/2022) for Chronic.   Worthy Keeler, DNP, AGNP-c 06/27/2022  8:11 AM

## 2022-07-03 NOTE — Assessment & Plan Note (Signed)
Well controlled on no medication at this time. Weight management seems to have significantly helped her blood pressures, this is great! We will continue to monitor closely. Diet and exercise recommendations reviewed. We will obtain labs today. No changes at this time.

## 2022-07-03 NOTE — Assessment & Plan Note (Signed)
Chronic. I suspect that her A1c will have come down now that she is on semaglutide and has lost a considerable amount of weight. Recommend continue on semaglutide at this time and we will monitor labs today. No concerning side effects from medication noted. Diet and exercise reviewed.

## 2022-07-03 NOTE — Assessment & Plan Note (Signed)
BMI down to 25.46 today. 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 There are no concerning symptoms present. She is following a healthy diet and exercise plan. I recommend that she continue at this time. Will obtain labs for evaluation.

## 2022-07-03 NOTE — Assessment & Plan Note (Signed)
Chronic. Currently on omega 3. She has lost a considerable amount of weight, so I suspect that this will show improvement as well. We will monitor labs.

## 2022-07-03 NOTE — Assessment & Plan Note (Signed)
Chronic. She is having pain in some of her joints at this time. I recommend voltaren gel as opposed to oral NSAIDs to avoid stomach upset. Routine exercise can be helpful to keep the joints loose and moving. If this becomes worse, she will let me know. No warmth or swelling present at this time.

## 2022-07-03 NOTE — Assessment & Plan Note (Signed)
Refill provided on clobetasol- this is well controlled with PRN treatment. No alarm symptoms.

## 2022-11-12 ENCOUNTER — Telehealth: Payer: Self-pay | Admitting: Nurse Practitioner

## 2022-11-12 DIAGNOSIS — E782 Mixed hyperlipidemia: Secondary | ICD-10-CM

## 2022-11-12 DIAGNOSIS — I10 Essential (primary) hypertension: Secondary | ICD-10-CM

## 2022-11-12 DIAGNOSIS — R7303 Prediabetes: Secondary | ICD-10-CM

## 2022-11-12 MED ORDER — OZEMPIC (2 MG/DOSE) 8 MG/3ML ~~LOC~~ SOPN: 2.0000 mg | PEN_INJECTOR | SUBCUTANEOUS | 3 refills | Status: DC

## 2022-11-12 NOTE — Telephone Encounter (Signed)
Pt husband was in for an appointment today and brought in a note from wife stating she needs a new prescription for ozempic 8mg /46ml pen sent to [facility], phone:[phone] or fax: [phone]

## 2022-11-12 NOTE — Telephone Encounter (Signed)
Medication sent.

## 2022-11-14 ENCOUNTER — Telehealth: Payer: Self-pay | Admitting: Nurse Practitioner

## 2022-11-14 LAB — HM MAMMOGRAPHY

## 2022-11-14 NOTE — Telephone Encounter (Signed)
Pt is needing a PA for her ozempic. She has some questions about it and asks if you can give her a call please

## 2022-11-19 DIAGNOSIS — M19012 Primary osteoarthritis, left shoulder: Secondary | ICD-10-CM | POA: Insufficient documentation

## 2022-11-25 ENCOUNTER — Telehealth: Payer: Self-pay | Admitting: Nurse Practitioner

## 2022-11-25 NOTE — Telephone Encounter (Signed)
Rx #: [id number] Ozempic (2 MG/DOSE) 8MG Ronny Bacon pen-injectors Form [facility] Electronic PA Form [phone] NCPDP)

## 2022-11-26 NOTE — Telephone Encounter (Signed)
Additional questions & chart notes sent for P.A.

## 2022-12-16 ENCOUNTER — Other Ambulatory Visit: Payer: Self-pay | Admitting: Nurse Practitioner

## 2022-12-16 DIAGNOSIS — I10 Essential (primary) hypertension: Secondary | ICD-10-CM

## 2022-12-16 DIAGNOSIS — R7303 Prediabetes: Secondary | ICD-10-CM

## 2022-12-16 DIAGNOSIS — E782 Mixed hyperlipidemia: Secondary | ICD-10-CM

## 2022-12-17 NOTE — Telephone Encounter (Signed)
P.A. denied, only covered for Type 2 diabetes,  [place] Dec do you think she would qualify for an appeal with the guidelines of Heart attack, stroke, PAD?

## 2022-12-29 ENCOUNTER — Encounter: Payer: 59 | Admitting: Nurse Practitioner

## 2023-01-08 ENCOUNTER — Encounter: Payer: Self-pay | Admitting: Nurse Practitioner

## 2023-01-08 ENCOUNTER — Ambulatory Visit: Payer: 59 | Admitting: Nurse Practitioner

## 2023-01-08 VITALS — BP 130/78 | HR 64 | Wt 167.2 lb

## 2023-01-08 DIAGNOSIS — E119 Type 2 diabetes mellitus without complications: Secondary | ICD-10-CM | POA: Diagnosis not present

## 2023-01-08 DIAGNOSIS — Z6837 Body mass index (BMI) 37.0-37.9, adult: Secondary | ICD-10-CM

## 2023-01-08 DIAGNOSIS — K2100 Gastro-esophageal reflux disease with esophagitis, without bleeding: Secondary | ICD-10-CM

## 2023-01-08 DIAGNOSIS — I10 Essential (primary) hypertension: Secondary | ICD-10-CM

## 2023-01-08 DIAGNOSIS — R252 Cramp and spasm: Secondary | ICD-10-CM

## 2023-01-08 MED ORDER — SEMAGLUTIDE (1 MG/DOSE) 4 MG/3ML ~~LOC~~ SOPN: 1.0000 mg | PEN_INJECTOR | SUBCUTANEOUS | 0 refills | Status: DC

## 2023-01-08 MED ORDER — SEMAGLUTIDE (2 MG/DOSE) 8 MG/3ML ~~LOC~~ SOPN: 2.0000 mg | PEN_INJECTOR | SUBCUTANEOUS | 3 refills | Status: DC

## 2023-01-08 NOTE — Progress Notes (Signed)
Shawna Clamp, DNP, AGNP-c [facility]  [address] [place], [place] [address] [phone]  ESTABLISHED PATIENT- Chronic Health and/or Follow-Up Visit  Blood pressure 130/78, pulse 64, weight 167 lb 3.2 oz (75.8 kg), last menstrual period 11/19/2008.    Margaret Ramirez is a 62 y.o. year old female presenting today for evaluation and management of chronic conditions.   Insurance did not cover the continuation of Ozempic.  Her A1c was up to 7 in the past and she was unable to get this under control with diet and exercise alone. Since starting on Ozempic she has been diligent with diet and exercise and has changed her lifestyle and developed health habits. She has lost 87 lbs total and her A1c at last check was down to 5.5% with her efforts. She has not been out of medication for 2 weeks and she is very concerned about the risk of her A1c going back up and her ability to maintain all that she has worked so hard to achieve. In addition to her A1c lowering, her blood pressure has improved, she has more energy, and her cholesterol has improved. She is hopeful that insurance will reconsider considering she has had a diagnosis of diabetes in the past, but has worked to get this under control.  She is currently going through [facility] mail order.   All ROS negative with exception of what is listed above.   PHYSICAL EXAM Physical Exam Vitals and nursing note reviewed.  Constitutional:      Appearance: Normal appearance.  HENT:     Head: Normocephalic.  Eyes:     Pupils: Pupils are equal, round, and reactive to light.  Cardiovascular:     Rate and Rhythm: Normal rate and regular rhythm.     Pulses: Normal pulses.     Heart sounds: Normal heart sounds.  Pulmonary:     Effort: Pulmonary effort is normal.     Breath sounds: Normal breath sounds.  Musculoskeletal:        General: Normal range of motion.     Cervical back: Normal range of motion.  Skin:    General: Skin is  warm.  Neurological:     General: No focal deficit present.     Mental Status: She is alert and oriented to person, place, and time.  Psychiatric:        Mood and Affect: Mood normal.     PLAN Problem List Items Addressed This Visit     Class 2 severe obesity with serious comorbidity and body mass index (BMI) of 37.0 to 37.9 in adult [facility])    She has gone from a weight of 253lb with a BMI of 37.42 down to a weight of 167 lb and a BMI of 24.69 with the aid of Ozempic and fastidious diet and exercise management. Unfortunately, insurance denied her coverage for the medication. She does have a history of her A1c reaching 7 several years ago, but she was able to get this down to pre-diabetic ranges. Since I have seen her, she has managed in the pre-diabetic range and now into normal range with the help of the medication. We will attempt to get coverage based on the history of diabetes and increased risks that stopping the medication will lead to recurrence.       Pre-diabetes    History of A1c 7%. Most recent 5.5%. She has utilized strict diet and exercise lifestyle changes and Ozempic for management and control. She would like to continue the medication,  however, given that her diagnosis is no longer diabetes, she has been denied coverage. We will resend this with the caveat that she has managed to get out of diabetes range for many years with her diet and exercise efforts and most recently has dropped her A1c to normal range with help of Ozempic.       Hypertension - Primary    Chronic and well controlled. No longer requiring medication management with the weight management she has achieved.       Bilateral leg cramps   Relevant Orders   CMP14+EGFR (Completed)   Magnesium (Completed)   GERD with esophagitis    Return in about 6 months (around 07/11/2023) for Med Management 30.  Time: 35 minutes, >50% spent counseling, care coordination, chart review, and documentation.   Shawna Clamp, DNP, AGNP-c

## 2023-01-08 NOTE — Patient Instructions (Signed)
I will resend the prescription and see if we can get further approval. I am so proud of you! You look amazing!!

## 2023-01-09 ENCOUNTER — Other Ambulatory Visit: Payer: Self-pay | Admitting: Nurse Practitioner

## 2023-01-09 DIAGNOSIS — E119 Type 2 diabetes mellitus without complications: Secondary | ICD-10-CM

## 2023-01-28 NOTE — Assessment & Plan Note (Signed)
She has gone from a weight of 253lb with a BMI of 37.42 down to a weight of 167 lb and a BMI of 24.69 with the aid of Ozempic and fastidious diet and exercise management. Unfortunately, insurance denied her coverage for the medication. She does have a history of her A1c reaching 7 several years ago, but she was able to get this down to pre-diabetic ranges. Since I have seen her, she has managed in the pre-diabetic range and now into normal range with the help of the medication. We will attempt to get coverage based on the history of diabetes and increased risks that stopping the medication will lead to recurrence.

## 2023-01-28 NOTE — Assessment & Plan Note (Signed)
Chronic and well controlled. No longer requiring medication management with the weight management she has achieved.

## 2023-01-28 NOTE — Assessment & Plan Note (Signed)
History of A1c 7%. Most recent 5.5%. She has utilized strict diet and exercise lifestyle changes and Ozempic for management and control. She would like to continue the medication, however, given that her diagnosis is no longer diabetes, she has been denied coverage. We will resend this with the caveat that she has managed to get out of diabetes range for many years with her diet and exercise efforts and most recently has dropped her A1c to normal range with help of Ozempic.

## 2023-03-20 ENCOUNTER — Other Ambulatory Visit: Payer: Self-pay | Admitting: Nurse Practitioner

## 2023-03-20 DIAGNOSIS — Z1212 Encounter for screening for malignant neoplasm of rectum: Secondary | ICD-10-CM

## 2023-03-20 DIAGNOSIS — Z1211 Encounter for screening for malignant neoplasm of colon: Secondary | ICD-10-CM

## 2023-06-16 IMAGING — DX DG CHEST 2V
2 series · 2 of 2 positions shown · non-contrast
Comparison: January 22, 2021.

CLINICAL DATA: Cough for several weeks.

EXAM:
CHEST - 2 VIEW

[chest pa]
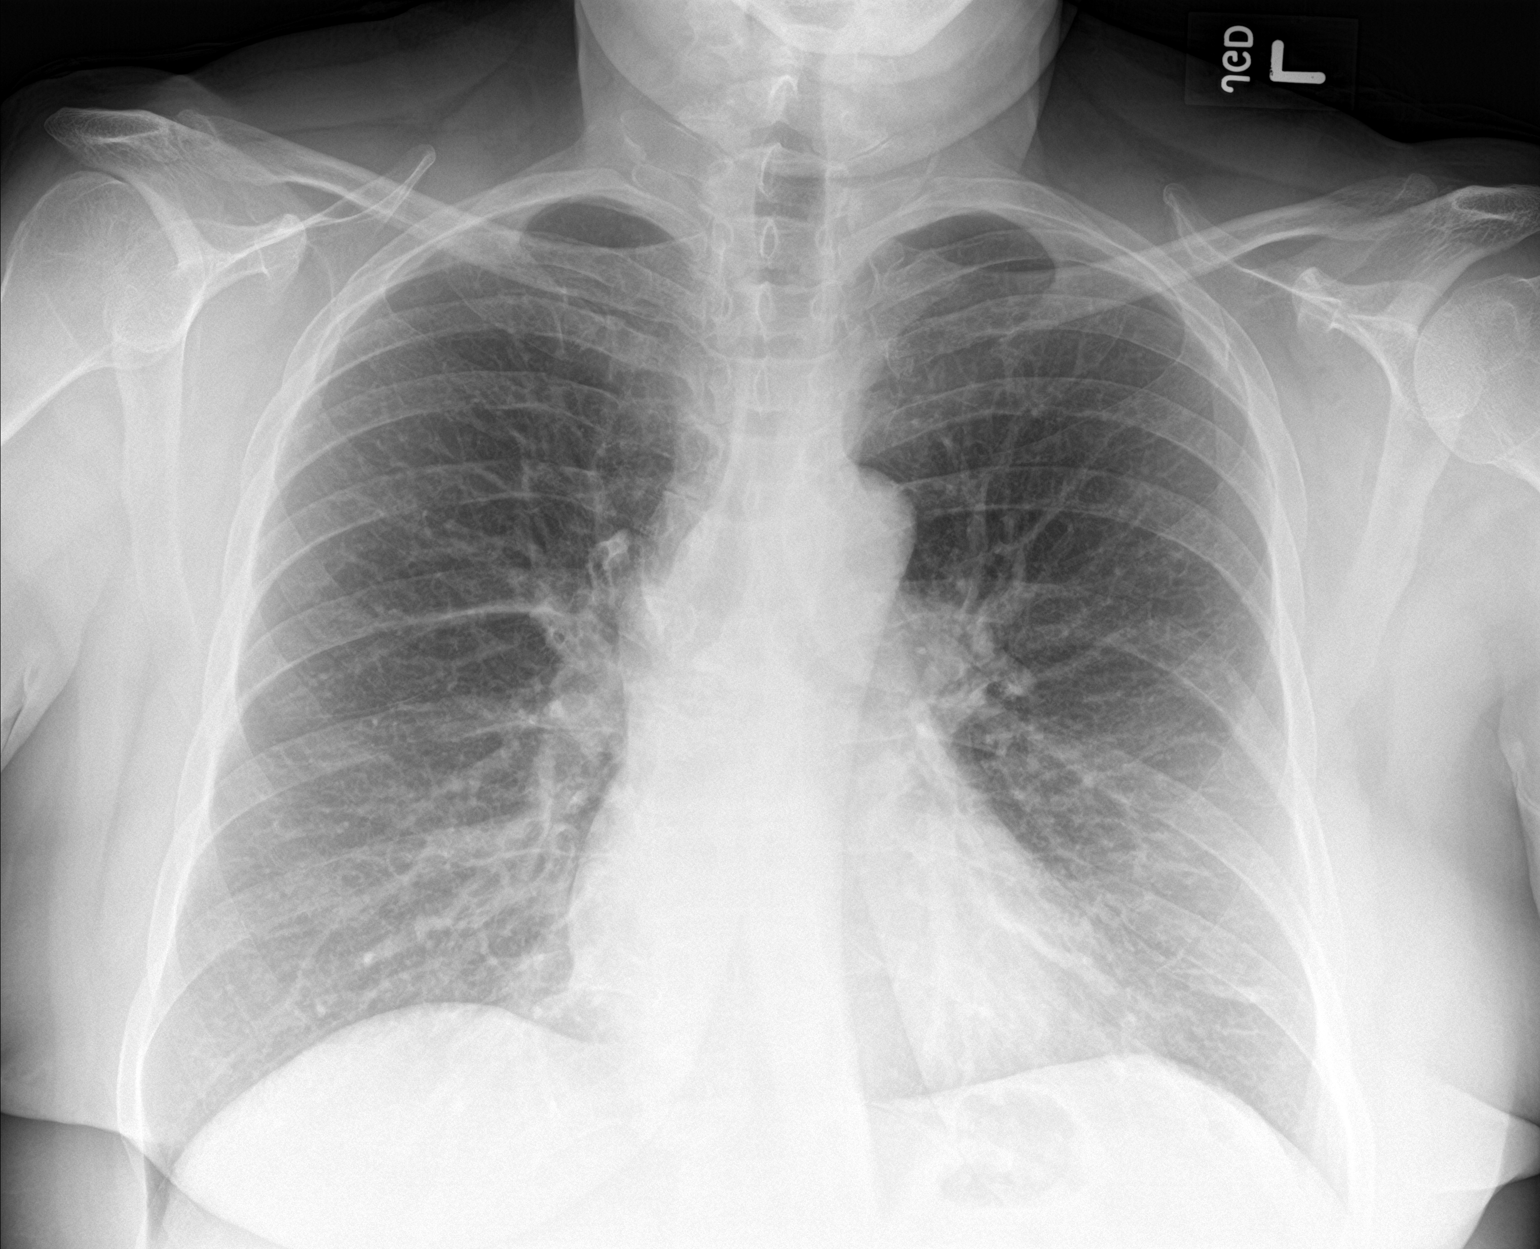

[chest lat]
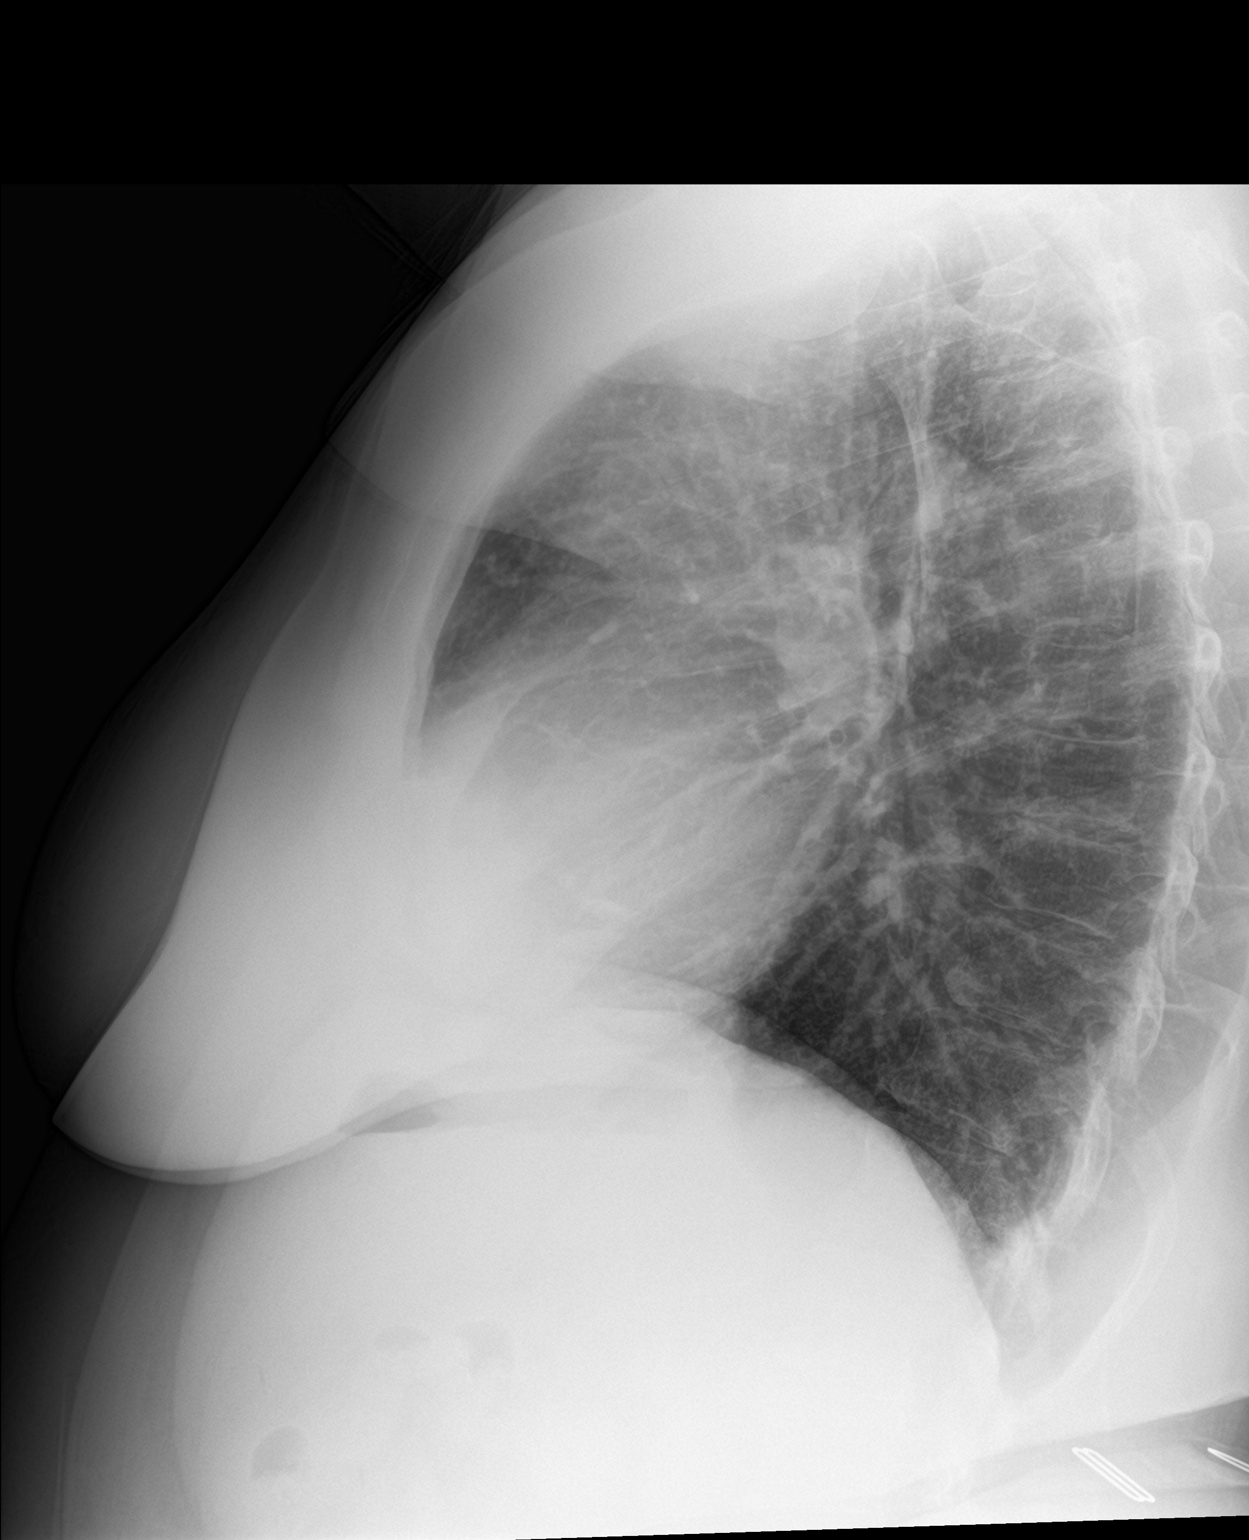

[2 of 2 positions shown; findings below may reference images not displayed]

FINDINGS: The heart size and mediastinal contours are within normal limits.
Both lungs are clear. The visualized skeletal structures are
unremarkable.
IMPRESSION: No active cardiopulmonary disease.

## 2023-07-14 ENCOUNTER — Encounter: Payer: Self-pay | Admitting: Nurse Practitioner

## 2023-07-14 ENCOUNTER — Ambulatory Visit: Payer: 59 | Admitting: Nurse Practitioner

## 2023-07-14 VITALS — BP 116/78 | HR 75 | Wt 172.8 lb

## 2023-07-14 DIAGNOSIS — E66812 Obesity, class 2: Secondary | ICD-10-CM | POA: Diagnosis not present

## 2023-07-14 DIAGNOSIS — E782 Mixed hyperlipidemia: Secondary | ICD-10-CM

## 2023-07-14 DIAGNOSIS — Z79899 Other long term (current) drug therapy: Secondary | ICD-10-CM

## 2023-07-14 DIAGNOSIS — M159 Polyosteoarthritis, unspecified: Secondary | ICD-10-CM

## 2023-07-14 DIAGNOSIS — Z23 Encounter for immunization: Secondary | ICD-10-CM | POA: Diagnosis not present

## 2023-07-14 DIAGNOSIS — R7303 Prediabetes: Secondary | ICD-10-CM | POA: Diagnosis not present

## 2023-07-14 DIAGNOSIS — I10 Essential (primary) hypertension: Secondary | ICD-10-CM

## 2023-07-14 DIAGNOSIS — E538 Deficiency of other specified B group vitamins: Secondary | ICD-10-CM

## 2023-07-14 DIAGNOSIS — F5101 Primary insomnia: Secondary | ICD-10-CM | POA: Diagnosis not present

## 2023-07-14 DIAGNOSIS — Z6837 Body mass index (BMI) 37.0-37.9, adult: Secondary | ICD-10-CM

## 2023-07-14 DIAGNOSIS — E559 Vitamin D deficiency, unspecified: Secondary | ICD-10-CM

## 2023-07-14 LAB — LIPID PANEL

## 2023-07-14 MED ORDER — TRAZODONE HCL 50 MG PO TABS
25.0000 mg | ORAL_TABLET | Freq: Every evening | ORAL | 1 refills | Status: DC | PRN
Start: 2023-07-14 — End: 2023-09-09

## 2023-07-14 MED ORDER — OMEGA-3-ACID ETHYL ESTERS 1 G PO CAPS: 1.0000 g | ORAL_CAPSULE | Freq: Two times a day (BID) | ORAL | 1 refills | Status: DC

## 2023-07-14 NOTE — Assessment & Plan Note (Signed)
Osteoarthritis primarily in thumbs, with the right thumb more affected. Using diclofenac gel for pain relief. Discussed potential for orthopedic interventions such as joint injections, previously effective for her shoulder. Explained that ibuprofen is effective but can cause stomach upset. Discussed the use of Voltaren gel as an alternative to oral NSAIDs.   - Continue using diclofenac gel for pain relief   - Consider referral to orthopedics for joint injections if pain persists

## 2023-07-14 NOTE — Assessment & Plan Note (Signed)
Currently on Ozempic for diabetes management, well-covered by insurance. No issues with medication access since the last visit. Discussed the importance of continuing medication for blood sugar control and overall health benefits.   - Continue Ozempic as prescribed

## 2023-07-14 NOTE — Assessment & Plan Note (Signed)
Reports difficulty sleeping and has previously tried hydroxyzine without sustained benefit. Discussed the option of trying trazodone, an old antidepressant often used for sleep due to its sedative effects. Trazodone is generally well-tolerated and does not typically cause next-day drowsiness. Discussed potential benefits and minimal side effects of trazodone for sleep.   - Prescribe trazodone for sleep

## 2023-07-14 NOTE — Patient Instructions (Addendum)
Keep working on your diet and increase your walking again. You are doing AMAZING!!! I am so proud of you.   Osteoarthritis  Osteoarthritis is a type of arthritis. It refers to joint pain or joint disease. Osteoarthritis affects tissue that covers the ends of bones in joints (cartilage). Cartilage acts as a cushion between the bones and helps them move smoothly. Osteoarthritis occurs when cartilage in the joints gets worn down. Osteoarthritis is sometimes called "wear and tear" arthritis. Osteoarthritis is the most common form of arthritis. It often occurs in older people. It is a condition that gets worse over time. The joints most often affected by this condition are in the fingers, toes, hips, knees, and spine, including the neck and lower back. What are the causes? This condition is caused by the wearing down of cartilage that covers the ends of bones. What increases the risk? The following factors may make you more likely to develop this condition: Being age 66 or older. Obesity. Overuse of joints. Past injury of a joint. Past surgery on a joint. Family history of osteoarthritis. What are the signs or symptoms? The main symptoms of this condition are pain, swelling, and stiffness in the joint. Other symptoms may include: An enlarged joint. More pain and further damage caused by small pieces of bone or cartilage that break off and float inside of the joint. Small deposits of bone (osteophytes) that grow on the edges of the joint. A grating or scraping feeling inside the joint when you move it. Popping or creaking sounds when you move. Difficulty walking or exercising. An inability to grip items, twist your hand, or control the movements of your hands and fingers. How is this diagnosed? This condition may be diagnosed based on: Your medical history. A physical exam. Your symptoms. X-rays of the affected joints. Blood tests to rule out other types of arthritis. How is this  treated? There is no cure for this condition, but treatment can help control pain and improve joint function. Treatment may include a combination of therapies, such as: Pain relief techniques, such as: Applying heat and cold to the joint. Massage. A form of talk therapy called cognitive behavioral therapy (CBT). This therapy helps you set goals and follow up on the changes that you make. Medicines for pain and inflammation. The medicines can be taken by mouth or applied to the skin. They include: NSAIDs, such as ibuprofen. Prescription medicines. Strong anti-inflammatory medicines (corticosteroids). Certain nutritional supplements. A prescribed exercise program. You may work with a physical therapist. Assistive devices, such as a brace, wrap, splint, specialized glove, or cane. A weight control plan. Surgery, such as: An osteotomy. This is done to reposition the bones and relieve pain or to remove loose pieces of bone and cartilage. Joint replacement surgery. You may need this surgery if you have advanced osteoarthritis. Follow these instructions at home: Activity Rest your affected joints as told by your health care provider. Exercise as told by your provider. The provider may recommend specific types of exercise, such as: Strengthening exercises. These are done to strengthen the muscles that support joints affected by arthritis. Aerobic activities. These are exercises, such as brisk walking or water aerobics, that increase your heart rate. Range-of-motion activities. These help your joints move more easily. Balance and agility exercises. Managing pain, stiffness, and swelling     If told, apply heat to the affected area as often as told by your provider. Use the heat source that your provider recommends, such as a moist  heat pack or a heating pad. If you have a removable assistive device, remove it as told by your provider. Place a towel between your skin and the heat source. If your  provider tells you to keep the assistive device on while you apply heat, place a towel between the assistive device and the heat source. Leave the heat on for 20-30 minutes. If told, put ice on the affected area. If you have a removable assistive device, remove it as told by your provider. Put ice in a plastic bag. Place a towel between your skin and the bag. If your provider tells you to keep the assistive device on during icing, place a towel between the assistive device and the bag. Leave the ice on for 20 minutes, 2-3 times a day. If your skin turns bright red, remove the ice or heat right away to prevent skin damage. The risk of damage is higher if you cannot feel pain, heat, or cold. Move your fingers or toes often to reduce stiffness and swelling. Raise (elevate) the affected area above the level of your heart while you are sitting or lying down. General instructions Take over-the-counter and prescription medicines only as told by your provider. Maintain a healthy weight. Follow instructions from your provider for weight control. Do not use any products that contain nicotine or tobacco. These products include cigarettes, chewing tobacco, and vaping devices, such as e-cigarettes. If you need help quitting, ask your provider. Use assistive devices as told by your provider. Where to find more information [employer] of Arthritis and Musculoskeletal and Skin Diseases: niams.http://www.myers.net/ [employer] on Aging: BaseRingTones.pl American College of Rheumatology: rheumatology.org Contact a health care provider if: You have redness, swelling, or a feeling of warmth in a joint that gets worse. You have a fever along with joint or muscle aches. You develop a rash. You have trouble doing your normal activities. You have pain that gets worse and is not relieved by pain medicine. This information is not intended to replace advice given to you by your health care provider. Make sure you discuss  any questions you have with your health care provider. Document Revised: 01/30/2022 Document Reviewed: 01/30/2022 Elsevier Patient Education  2024 [employer].

## 2023-07-14 NOTE — Assessment & Plan Note (Signed)
Maintaining weight for the past six months with normal eating habits. Experiences cravings with higher carbohydrate and sugar intake. Plans to reduce carbohydrate intake to lose more weight. Current weight and blood pressure are within normal limits. Good energy levels and increasing walking. Discussed the impact of carbohydrates and sugars on cravings and weight management. Emphasized the importance of physical activity.   - Encourage reduction of carbohydrate intake   - Increase physical activity, particularly walking

## 2023-07-14 NOTE — Progress Notes (Signed)
Shawna Clamp, DNP, AGNP-c [facility]  [address] [place], [place] [address] [phone]  ESTABLISHED PATIENT- Chronic Health and/or Follow-Up Visit  Blood pressure 116/78, pulse 75, weight 172 lb 12.8 oz (78.4 kg), last menstrual period 11/19/2008.    Margaret Ramirez is a 63 y.o. year old female presenting today for evaluation and management of chronic conditions.  Weight management, HLD, Insomnia  Update HM- colonoscopy with Dr. Loreta Ave at age 53.  History of Present Illness Margaret Ramirez presents for follow-up about weight management and arthritis pain.  She has been attempting to maintain her weight over the past year by eating normally but has noticed increased cravings for food, particularly when consuming more carbohydrates. Her energy levels are good, but she struggles with sleep, describing it as 'normal old lady don't sleep good.' She is currently taking Ozempic, which is covered by her insurance without issues. She is trying to increase her walking activity, which has decreased due to cold weather.  She experiences arthritis pain primarily in her thumbs, with the right thumb being more affected. She describes it as 'bone on bone' and attributes it to years of work. She has been using Aleve and Voltaren gel for pain management. She has had a meniscus repair on both knees and reports no current knee trouble, attributing this to the weight loss and previous surgical intervention.  She has tried hydroxyzine for sleep, which helped initially but is no longer effective. No chest pain, shortness of breath, upset stomach, or swelling in her feet or ankles. No recent vaginal bleeding since a previous episode of postmenopausal bleeding, which resolved on its own.  Her sister has diabetes, pancreatitis, and is on [place]. She struggles with maintaining a healthy diet and lifestyle.  All ROS negative with exception of what is listed above.   PHYSICAL EXAM Physical  Exam Vitals and nursing note reviewed.  Constitutional:      General: She is not in acute distress.    Appearance: Normal appearance. She is not ill-appearing.  HENT:     Head: Normocephalic.  Eyes:     Conjunctiva/sclera: Conjunctivae normal.     Pupils: Pupils are equal, round, and reactive to light.  Cardiovascular:     Rate and Rhythm: Normal rate and regular rhythm.     Pulses: Normal pulses.     Heart sounds: Normal heart sounds.  Pulmonary:     Effort: Pulmonary effort is normal.     Breath sounds: Normal breath sounds.  Abdominal:     General: Bowel sounds are normal. There is no distension.     Palpations: Abdomen is soft.     Tenderness: There is no abdominal tenderness.  Musculoskeletal:        General: Tenderness present. Normal range of motion.     Cervical back: Normal range of motion.     Right lower leg: No edema.     Left lower leg: No edema.     Comments: Arthritis in the thumb joints bilaterally and left shoulder.   Skin:    General: Skin is warm.     Capillary Refill: Capillary refill takes less than 2 seconds.  Neurological:     General: No focal deficit present.     Mental Status: She is alert and oriented to person, place, and time.  Psychiatric:        Mood and Affect: Mood normal.      PLAN Problem List Items Addressed This Visit     Class 2 severe obesity  with serious comorbidity and body mass index (BMI) of 37.0 to 37.9 in adult [facility])   Relevant Medications   omega-3 acid ethyl esters (LOVAZA) 1 g capsule   Pre-diabetes   Relevant Medications   omega-3 acid ethyl esters (LOVAZA) 1 g capsule   Hypertension   Relevant Medications   omega-3 acid ethyl esters (LOVAZA) 1 g capsule   Osteoarthritis involving multiple joints on both sides of body   Mixed hyperlipidemia   Relevant Medications   omega-3 acid ethyl esters (LOVAZA) 1 g capsule   Other Visit Diagnoses       Need for influenza vaccination    -  Primary   Relevant Orders   Flu  vaccine trivalent PF, 6mos and older(Flulaval,Afluria,Fluarix,Fluzone) (Completed)     Primary insomnia       Relevant Medications   traZODone (DESYREL) 50 MG tablet       Return in about 6 months (around 01/11/2024) for CPE.  Shawna Clamp, DNP, AGNP-c

## 2023-07-14 NOTE — Assessment & Plan Note (Signed)
Monitoring labs for deficiencies today.

## 2023-07-15 LAB — LIPID PANEL
Cholesterol, Total: 228 mg/dL — ABNORMAL HIGH (ref 100–199)
HDL: 68 mg/dL (ref >39)
LDL CALC COMMENT:: 3.4 ratio (ref 0.0–4.4)
LDL Chol Calc (NIH): 149 mg/dL — ABNORMAL HIGH (ref 0–99)
Triglycerides: 64 mg/dL (ref 0–149)
VLDL Cholesterol Cal: 11 mg/dL (ref 5–40)

## 2023-07-15 LAB — COMPREHENSIVE METABOLIC PANEL
ALT: 8 [IU]/L (ref 0–32)
AST: 11 [IU]/L (ref 0–40)
Albumin: 4.2 g/dL (ref 3.9–4.9)
Alkaline Phosphatase: 69 [IU]/L (ref 44–121)
BUN/Creatinine Ratio: 21 (ref 12–28)
BUN: 15 mg/dL (ref 8–27)
Bilirubin Total: 0.3 mg/dL (ref 0.0–1.2)
CO2: 25 mmol/L (ref 20–29)
Calcium: 9.4 mg/dL (ref 8.7–10.3)
Chloride: 105 mmol/L (ref 96–106)
Creatinine, Ser: 0.71 mg/dL (ref 0.57–1.00)
Globulin, Total: 2.4 g/dL (ref 1.5–4.5)
Glucose: 89 mg/dL (ref 70–99)
Potassium: 4.8 mmol/L (ref 3.5–5.2)
Sodium: 142 mmol/L (ref 134–144)
Total Protein: 6.6 g/dL (ref 6.0–8.5)
eGFR: 96 mL/min/{1.73_m2} (ref >59)

## 2023-07-15 LAB — CBC WITH DIFFERENTIAL/PLATELET
Basophils Absolute: 0.1 10*3/uL (ref 0.0–0.2)
Basos: 2 %
EOS (ABSOLUTE): 0.2 10*3/uL (ref 0.0–0.4)
Eos: 3 %
Hematocrit: 36.6 % (ref 34.0–46.6)
Hemoglobin: 12.1 g/dL (ref 11.1–15.9)
Immature Grans (Abs): 0 10*3/uL (ref 0.0–0.1)
Immature Granulocytes: 0 %
Lymphocytes Absolute: 1.7 10*3/uL (ref 0.7–3.1)
Lymphs: 36 %
MCH: 29.7 pg (ref 26.6–33.0)
MCHC: 33.1 g/dL (ref 31.5–35.7)
MCV: 90 fL (ref 79–97)
Monocytes Absolute: 0.3 10*3/uL (ref 0.1–0.9)
Monocytes: 7 %
Neutrophils Absolute: 2.4 10*3/uL (ref 1.4–7.0)
Neutrophils: 52 %
Platelets: 292 10*3/uL (ref 150–450)
RBC: 4.08 x10E6/uL (ref 3.77–5.28)
RDW: 13.2 % (ref 11.7–15.4)
WBC: 4.7 10*3/uL (ref 3.4–10.8)

## 2023-07-15 LAB — HEMOGLOBIN A1C
Est. average glucose Bld gHb Est-mCnc: 111 mg/dL
Hgb A1c MFr Bld: 5.5 % (ref 4.8–5.6)

## 2023-07-15 LAB — VITAMIN B12: Vitamin B-12: 283 pg/mL (ref 232–1245)

## 2023-07-15 LAB — VITAMIN D 25 HYDROXY (VIT D DEFICIENCY, FRACTURES): Vit D, 25-Hydroxy: 28.7 ng/mL — ABNORMAL LOW (ref 30.0–100.0)

## 2023-07-22 ENCOUNTER — Encounter: Payer: Self-pay | Admitting: Nurse Practitioner

## 2023-07-22 MED ORDER — VITAMIN D (ERGOCALCIFEROL) 1.25 MG (50000 UNIT) PO CAPS: 50000.0000 [IU] | ORAL_CAPSULE | ORAL | 3 refills | Status: AC

## 2023-07-28 ENCOUNTER — Other Ambulatory Visit: Payer: Self-pay

## 2023-07-28 MED ORDER — ATORVASTATIN CALCIUM 10 MG PO TABS: 10.0000 mg | ORAL_TABLET | ORAL | 1 refills | Status: DC

## 2023-07-28 MED ORDER — VITAMIN B-12 50 MCG PO TABS: 50.0000 ug | ORAL_TABLET | Freq: Every day | ORAL | 5 refills | Status: DC

## 2023-08-10 ENCOUNTER — Other Ambulatory Visit: Payer: Self-pay | Admitting: Nurse Practitioner

## 2023-08-10 DIAGNOSIS — F5101 Primary insomnia: Secondary | ICD-10-CM

## 2023-09-09 ENCOUNTER — Other Ambulatory Visit: Payer: Self-pay | Admitting: Nurse Practitioner

## 2023-09-09 DIAGNOSIS — F5101 Primary insomnia: Secondary | ICD-10-CM

## 2023-09-09 NOTE — Telephone Encounter (Signed)
 Last apt 07/14/23.

## 2023-10-23 ENCOUNTER — Other Ambulatory Visit: Payer: 59

## 2023-10-23 DIAGNOSIS — E782 Mixed hyperlipidemia: Secondary | ICD-10-CM

## 2023-10-23 DIAGNOSIS — E538 Deficiency of other specified B group vitamins: Secondary | ICD-10-CM

## 2023-10-23 DIAGNOSIS — R7303 Prediabetes: Secondary | ICD-10-CM

## 2023-10-24 LAB — VITAMIN B12: Vitamin B-12: 284 pg/mL (ref 232–1245)

## 2023-10-24 LAB — COMPREHENSIVE METABOLIC PANEL WITH GFR
ALT: 15 IU/L (ref 0–32)
AST: 14 IU/L (ref 0–40)
Albumin: 4 g/dL (ref 3.9–4.9)
Alkaline Phosphatase: 72 IU/L (ref 44–121)
BUN/Creatinine Ratio: 23 (ref 12–28)
BUN: 15 mg/dL (ref 8–27)
Bilirubin Total: 0.4 mg/dL (ref 0.0–1.2)
CO2: 22 mmol/L (ref 20–29)
Calcium: 9.3 mg/dL (ref 8.7–10.3)
Chloride: 103 mmol/L (ref 96–106)
Creatinine, Ser: 0.66 mg/dL (ref 0.57–1.00)
Globulin, Total: 2.3 g/dL (ref 1.5–4.5)
Glucose: 79 mg/dL (ref 70–99)
Potassium: 4.5 mmol/L (ref 3.5–5.2)
Sodium: 139 mmol/L (ref 134–144)
Total Protein: 6.3 g/dL (ref 6.0–8.5)
eGFR: 99 mL/min/{1.73_m2} (ref >59)

## 2023-10-24 LAB — LIPID PANEL
Chol/HDL Ratio: 3.1 ratio (ref 0.0–4.4)
Cholesterol, Total: 199 mg/dL (ref 100–199)
HDL: 65 mg/dL (ref >39)
LDL Chol Calc (NIH): 124 mg/dL — ABNORMAL HIGH (ref 0–99)
Triglycerides: 54 mg/dL (ref 0–149)
VLDL Cholesterol Cal: 10 mg/dL (ref 5–40)

## 2023-11-11 ENCOUNTER — Ambulatory Visit: Payer: Self-pay | Admitting: Nurse Practitioner

## 2023-11-28 ENCOUNTER — Other Ambulatory Visit: Payer: Self-pay | Admitting: Nurse Practitioner

## 2023-11-28 DIAGNOSIS — E119 Type 2 diabetes mellitus without complications: Secondary | ICD-10-CM

## 2023-12-10 ENCOUNTER — Other Ambulatory Visit (INDEPENDENT_AMBULATORY_CARE_PROVIDER_SITE_OTHER)

## 2023-12-10 DIAGNOSIS — E538 Deficiency of other specified B group vitamins: Secondary | ICD-10-CM

## 2023-12-10 MED ORDER — CYANOCOBALAMIN 1000 MCG/ML IJ SOLN
1000.0000 ug | Freq: Once | INTRAMUSCULAR | Status: AC
  Administered 2023-12-10: 1000 ug via INTRAMUSCULAR

## 2024-01-10 ENCOUNTER — Other Ambulatory Visit: Payer: Self-pay | Admitting: Nurse Practitioner

## 2024-01-11 ENCOUNTER — Ambulatory Visit: Payer: 59 | Admitting: Nurse Practitioner

## 2024-01-11 ENCOUNTER — Encounter: Payer: Self-pay | Admitting: Nurse Practitioner

## 2024-01-11 VITALS — BP 120/80 | HR 80 | Ht 67.0 in | Wt 177.6 lb

## 2024-01-11 DIAGNOSIS — Z0000 Encounter for general adult medical examination without abnormal findings: Secondary | ICD-10-CM | POA: Insufficient documentation

## 2024-01-11 DIAGNOSIS — E538 Deficiency of other specified B group vitamins: Secondary | ICD-10-CM | POA: Insufficient documentation

## 2024-01-11 DIAGNOSIS — L109 Pemphigus, unspecified: Secondary | ICD-10-CM

## 2024-01-11 DIAGNOSIS — R7303 Prediabetes: Secondary | ICD-10-CM | POA: Diagnosis not present

## 2024-01-11 DIAGNOSIS — E782 Mixed hyperlipidemia: Secondary | ICD-10-CM

## 2024-01-11 DIAGNOSIS — F5101 Primary insomnia: Secondary | ICD-10-CM

## 2024-01-11 DIAGNOSIS — I10 Essential (primary) hypertension: Secondary | ICD-10-CM | POA: Diagnosis not present

## 2024-01-11 MED ORDER — CLOBETASOL PROPIONATE 0.05 % EX CREA: TOPICAL_CREAM | CUTANEOUS | 3 refills | Status: AC

## 2024-01-11 MED ORDER — CYANOCOBALAMIN 1000 MCG/ML IJ SOLN
1000.0000 ug | Freq: Once | INTRAMUSCULAR | Status: AC
  Administered 2024-01-11: 1000 ug via INTRAMUSCULAR

## 2024-01-11 NOTE — Progress Notes (Signed)
 Margaret Doing, DNP, AGNP-c [facility] [address] [place], [place] [address] Main Office [phone]  BP 120/80   Pulse 80   Ht 5' 7 (1.702 m)   Wt 177 lb 9.6 oz (80.6 kg)   LMP 11/19/2008   BMI 27.82 kg/m    Subjective:    Patient ID: Margaret Ramirez, female    DOB: May 20, 1961, 63 y.o.   MRN: [id number]  HPI: History of Present Illness Margaret Ramirez is a 63 year old female who presents for CPE.  She feels more energetic after her previous B12 injection and prefers injections over oral supplements for convenience. She is here to receive her second injection.  She is monitoring her cholesterol levels and notes a slight improvement in her last lab results. She gained a few pounds during a recent vacation but is trying to watch her diet, particularly her protein intake.  She is currently using Ozempic  and reports that it agrees with her. She is mindful of her sugar intake and tries to maintain her weight. She also uses trazodone  as needed to help with sleep, especially during stressful times, such as her sister's recent transition to hospice care.  She has a history of pemphigus and experiences occasional flare-ups, which she manages with clobetasol  cream. She recently had a recurrence, possibly due to stress, but finds that the cream effectively clears it up.  No fullness in her throat, difficulty swallowing, shortness of breath, chest pain, dizziness, changes in bowel or bladder habits, vaginal bleeding, or swelling in her feet or ankles.  Pertinent items are noted in HPI.    Most Recent Depression Screen:     01/11/2024    9:50 AM 07/14/2023    8:10 AM 01/08/2023    3:45 PM 06/27/2022    8:08 AM 04/23/2021   10:17 AM  Depression screen PHQ 2/9  Decreased Interest 0 0 0 0 0  Down, Depressed, Hopeless 0 0 0 0 0  PHQ - 2 Score 0 0 0 0 0   Most Recent Anxiety Screen:      No data to display         Most Recent Fall Screen:    01/11/2024     9:50 AM 07/14/2023    8:10 AM 01/08/2023    3:45 PM 06/27/2022    8:08 AM 12/25/2021   11:09 AM  Fall Risk   Falls in the past year? 0 0 0 0 0  Number falls in past yr: 0 0 0 0 0  Injury with Fall? 0 0 0 0 0  Risk for fall due to : No Fall Risks No Fall Risks No Fall Risks No Fall Risks No Fall Risks  Follow up Falls evaluation completed Falls evaluation completed Falls evaluation completed Falls evaluation completed  Falls evaluation completed      Data saved with a previous flowsheet row definition    Past medical history, surgical history, medications, allergies, family history and social history reviewed with patient today and changes made to appropriate areas of the chart.  Past Medical History:  Past Medical History:  Diagnosis Date   Acute meniscal tear of left knee    s/p repair    Arthritis    Pemphigus vulgaris    Dr. Arlen   Postmenopausal bleeding 06/27/2022   Reflux esophagitis 12/15/2010   EGD with Dr. Kristie; grade 1 distal esophagitis and peptic-type duodenopathy on path   Vitamin D  deficiency    Wears contact lenses  Medications:  Current Outpatient Medications on File Prior to Visit  Medication Sig   Cyanocobalamin  (B-12 COMPLIANCE INJECTION) 1000 MCG/ML KIT Inject 1 mg as directed every 30 (thirty) days.   diclofenac  Sodium (VOLTAREN ) 1 % GEL Apply 2 g topically 4 (four) times daily.   Insulin Pen Needle (PEN NEEDLES) 33G X 4 MM MISC 1 Units by Does not apply route once a week.   omega-3 acid ethyl esters (LOVAZA ) 1 g capsule Take 1 capsule (1 g total) by mouth 2 (two) times daily.   promethazine  (PHENERGAN ) 25 MG tablet  (Patient taking differently: prn)   Semaglutide , 2 MG/DOSE, (OZEMPIC , 2 MG/DOSE,) 8 MG/3ML SOPN INJECT 2MG  ONCE A WEEK AS  DIRECTED   traZODone  (DESYREL ) 50 MG tablet TAKE 1/2 TABLET TO 2 TABLET BY MOUTH AT BEDTIME AS NEEDED FOR SLEEP   Vitamin D , Ergocalciferol , (DRISDOL ) 1.25 MG (50000 UNIT) CAPS capsule Take 1 capsule (50,000 Units  total) by mouth every 7 (seven) days.   atorvastatin  (LIPITOR) 10 MG tablet TAKE 1 TABLET (10 MG TOTAL) BY MOUTH ONCE A WEEK.   No current facility-administered medications on file prior to visit.   Surgical History:  Past Surgical History:  Procedure Laterality Date   CESAREAN SECTION  1992   CHONDROPLASTY Left 03/14/2014   Procedure: CHONDROPLASTY;  Surgeon: Lamar Collet, MD;  Location: [facility];  Service: Orthopedics;  Laterality: Left;   COLONOSCOPY WITH ESOPHAGOGASTRODUODENOSCOPY (EGD)  01-31-2011   KNEE ARTHROSCOPY Left 03/14/2014   Procedure: ARTHROSCOPY KNEE;  Surgeon: Lamar Collet, MD;  Location: [facility];  Service: Orthopedics;  Laterality: Left;   KNEE ARTHROSCOPY WITH MEDIAL MENISECTOMY Left 03/14/2014   Procedure: KNEE ARTHROSCOPY WITH MEDIAL MENISECTOMY;  Surgeon: Lamar Collet, MD;  Location: [facility];  Service: Orthopedics;  Laterality: Left;   LAPARSCOPIC RIGHT OVARIAN CYSTECTOMY  11-21-1999   Allergies:  Allergies  Allergen Reactions   Cephalexin Hives   Cefprozil Rash    On thighs and forarms.   Family History:  Family History  Problem Relation Age of Onset   Cancer Mother        lung   Asthma Mother    COPD Father        Emphysema   Heart disease Father    Hyperlipidemia Sister    Hyperlipidemia Brother    Hypertension Brother    COPD Paternal Grandfather        Black lung   Diabetes Neg Hx        Objective:    BP 120/80   Pulse 80   Ht 5' 7 (1.702 m)   Wt 177 lb 9.6 oz (80.6 kg)   LMP 11/19/2008   BMI 27.82 kg/m   Wt Readings from Last 3 Encounters:  01/11/24 177 lb 9.6 oz (80.6 kg)  07/14/23 172 lb 12.8 oz (78.4 kg)  01/08/23 167 lb 3.2 oz (75.8 kg)    Physical Exam Vitals and nursing note reviewed.  Constitutional:      General: She is not in acute distress.    Appearance: Normal appearance.  HENT:     Head: Normocephalic and atraumatic.     Right Ear: Hearing, tympanic  membrane, ear canal and external ear normal.     Left Ear: Hearing, tympanic membrane, ear canal and external ear normal.     Nose: Nose normal.     Right Sinus: No maxillary sinus tenderness or frontal sinus tenderness.     Left Sinus: No maxillary sinus tenderness  or frontal sinus tenderness.     Mouth/Throat:     Lips: Pink.     Mouth: Mucous membranes are moist.     Pharynx: Oropharynx is clear.  Eyes:     General: Lids are normal. Vision grossly intact.     Extraocular Movements: Extraocular movements intact.     Conjunctiva/sclera: Conjunctivae normal.     Pupils: Pupils are equal, round, and reactive to light.     Funduscopic exam:    Right eye: Red reflex present.        Left eye: Red reflex present.    Visual Fields: Right eye visual fields normal and left eye visual fields normal.  Neck:     Thyroid : No thyromegaly.     Vascular: No carotid bruit.  Cardiovascular:     Rate and Rhythm: Normal rate and regular rhythm.     Chest Wall: PMI is not displaced.     Pulses: Normal pulses.          Dorsalis pedis pulses are 2+ on the right side and 2+ on the left side.       Posterior tibial pulses are 2+ on the right side and 2+ on the left side.     Heart sounds: Normal heart sounds. No murmur heard. Pulmonary:     Effort: Pulmonary effort is normal. No respiratory distress.     Breath sounds: Normal breath sounds.  Abdominal:     General: Abdomen is flat. Bowel sounds are normal. There is no distension.     Palpations: Abdomen is soft. There is no hepatomegaly, splenomegaly or mass.     Tenderness: There is no abdominal tenderness. There is no right CVA tenderness, left CVA tenderness, guarding or rebound.  Musculoskeletal:        General: Normal range of motion.     Cervical back: Full passive range of motion without pain, normal range of motion and neck supple. No tenderness.     Right lower leg: No edema.     Left lower leg: No edema.  Feet:     Left foot:     Toenail  Condition: Left toenails are normal.  Lymphadenopathy:     Cervical: No cervical adenopathy.     Upper Body:     Right upper body: No supraclavicular adenopathy.     Left upper body: No supraclavicular adenopathy.  Skin:    General: Skin is warm and dry.     Capillary Refill: Capillary refill takes less than 2 seconds.     Nails: There is no clubbing.  Neurological:     General: No focal deficit present.     Mental Status: She is alert and oriented to person, place, and time.     GCS: GCS eye subscore is 4. GCS verbal subscore is 5. GCS motor subscore is 6.     Sensory: Sensation is intact. No sensory deficit.     Motor: Motor function is intact. No weakness.     Coordination: Coordination is intact. Coordination normal.     Gait: Gait is intact. Gait normal.     Deep Tendon Reflexes: Reflexes are normal and symmetric.  Psychiatric:        Attention and Perception: Attention normal.        Mood and Affect: Mood normal.        Speech: Speech normal.        Behavior: Behavior normal. Behavior is cooperative.        Thought Content: Thought  content normal.        Cognition and Memory: Cognition and memory normal.        Judgment: Judgment normal.      Results for orders placed or performed in visit on 10/23/23  Comprehensive metabolic panel   Collection Time: 10/23/23  8:29 AM  Result Value Ref Range   Glucose 79 70 - 99 mg/dL   BUN 15 8 - 27 mg/dL   Creatinine, Ser 9.33 0.57 - 1.00 mg/dL   eGFR 99 >40 fO/fpw/8.26   BUN/Creatinine Ratio 23 12 - 28   Sodium 139 134 - 144 mmol/L   Potassium 4.5 3.5 - 5.2 mmol/L   Chloride 103 96 - 106 mmol/L   CO2 22 20 - 29 mmol/L   Calcium  9.3 8.7 - 10.3 mg/dL   Total Protein 6.3 6.0 - 8.5 g/dL   Albumin 4.0 3.9 - 4.9 g/dL   Globulin, Total 2.3 1.5 - 4.5 g/dL   Bilirubin Total 0.4 0.0 - 1.2 mg/dL   Alkaline Phosphatase 72 44 - 121 IU/L   AST 14 0 - 40 IU/L   ALT 15 0 - 32 IU/L  Lipid panel   Collection Time: 10/23/23  8:29 AM   Result Value Ref Range   Cholesterol, Total 199 100 - 199 mg/dL   Triglycerides 54 0 - 149 mg/dL   HDL 65 >60 mg/dL   VLDL Cholesterol Cal 10 5 - 40 mg/dL   LDL Chol Calc (NIH) 875 (H) 0 - 99 mg/dL   Chol/HDL Ratio 3.1 0.0 - 4.4 ratio  Vitamin B12   Collection Time: 10/23/23  8:29 AM  Result Value Ref Range   Vitamin B-12 284 232 - 1,245 pg/mL       Assessment & Plan:   Problem List Items Addressed This Visit     Pemphigus   Recent flare-up possibly due to stress related to her sister's health. Clobetasol  cream effectively manages blisters. Aware of the need to manage stress to prevent flare-ups. - Prescribe [facility]ol  cre[facility] to [facility] [address]      Relevant Medications   clobetasol  cream (TEMOVATE ) 0.05 %   Pre-diabetes   Well controlled at this time with diet and exercise, weight loss, and Ozempic . No concerns at this time. Recommend continue current management.       Mixed hyperlipidemia   Cholesterol levels improved in May labs. Actively managing cholesterol through diet, though reports difficulty maintaining protein intake. Cholesterol levels do not require rechecking today. Will check annually.       Hypertension   Blood pressures are well controlled with no concerning symptoms. Plan to continue to work with diet and exercise management. No medication at this time.       Primary insomnia   Uses trazodone  as needed for sleep, particularly during stressful periods related to her sister's transition to hospice care. Medication is effective and not required nightly.      B12 deficiency - Primary   Improved energy levels following initial B12 injection. Injections preferred due to superior absorption and compliance compared to oral supplements, which are less effective due to poor gastrointestinal absorption. Prefers injections due to difficulty remembering daily medications. - Administer second B12 injection today - Recheck B12 levels after a couple of months of  injections       Encounter for annual physical exam   CPE completed today. Review of HM activities and recommendations discussed and provided on AVS. Anticipatory guidance, diet, and exercise recommendations provided. Medications, allergies, and hx  reviewed and updated as necessary. Orders placed as listed below.  Plan: - Labs ordered or reviewed from last visit. Will make changes as necessary based on results.  - I will review these results and send recommendations via MyChart or a telephone call.  - F/U with CPE in 1 year or sooner for acute/chronic health needs as directed.           Follow up plan: Return in about 6 months (around 07/13/2024) for Med Management 30.  NEXT PREVENTATIVE PHYSICAL DUE IN 1 YEAR.  PATIENT COUNSELING PROVIDED FOR ALL ADULT PATIENTS: A well balanced diet low in saturated fats, cholesterol, and moderation in carbohydrates.  This can be as simple as monitoring portion sizes and cutting back on sugary beverages such as soda and juice to start with.    Daily water consumption of at least 64 ounces.  Physical activity at least 180 minutes per week.  If just starting out, start 10 minutes a day and work your way up.   This can be as simple as taking the stairs instead of the elevator and walking 2-3 laps around the office  purposefully every day.   STD protection, partner selection, and regular testing if high risk.  Limited consumption of alcoholic beverages if alcohol is consumed. For men, I recommend no more than 14 alcoholic beverages per week, spread out throughout the week (max 2 per day). Avoid binge drinking or consuming large quantities of alcohol in one setting.  Please let me know if you feel you may need help with reduction or quitting alcohol consumption.   Avoidance of nicotine, if used. Please let me know if you feel you may need help with reduction or quitting nicotine use.   Daily mental health attention. This can be in the form of 5  minute daily meditation, prayer, journaling, yoga, reflection, etc.  Purposeful attention to your emotions and mental state can significantly improve your overall wellbeing  and  Health.  Please know that I am here to help you with all of your health care goals and am happy to work with you to find a solution that works best for you.  The greatest advice I have received with any changes in life are to take it one step at a time, that even means if all you can focus on is the next 60 seconds, then do that and celebrate your victories.  With any changes in life, you will have set backs, and that is OK. The important thing to remember is, if you have a set back, it is not a failure, it is an opportunity to try again! Screening Testing Mammogram Every 1 -2 years based on history and risk factors Starting at age 67 Pap Smear Ages 21-39 every 3 years Ages 52-65 every 5 years with HPV testing More frequent testing may be required based on results and history Colon Cancer Screening Every 1-10 years based on test performed, risk factors, and history Starting at age 55 Bone Density Screening Every 2-10 years based on history Starting at age 72 for women Recommendations for men differ based on medication usage, history, and risk factors AAA Screening One time ultrasound Men 66-56 years old who have every smoked Lung Cancer Screening Low Dose Lung CT every 12 months Age 52-80 years with a 30 pack-year smoking history who still smoke or who have quit within the last 15 years   Screening Labs Routine  Labs: Complete Blood Count (CBC), Complete Metabolic Panel (CMP), Cholesterol (Lipid  Panel) Every 6-12 months based on history and medications May be recommended more frequently based on current conditions or previous results Hemoglobin A1c Lab Every 3-12 months based on history and previous results Starting at age 42 or earlier with diagnosis of diabetes, high cholesterol, BMI >26, and/or risk  factors Frequent monitoring for patients with diabetes to ensure blood sugar control Thyroid  Panel (TSH) Every 6 months based on history, symptoms, and risk factors May be repeated more often if on medication HIV One time testing for all patients 74 and older May be repeated more frequently for patients with increased risk factors or exposure Hepatitis C One time testing for all patients 74 and older May be repeated more frequently for patients with increased risk factors or exposure Gonorrhea, Chlamydia Every 12 months for all sexually active persons 13-24 years Additional monitoring may be recommended for those who are considered high risk or who have symptoms Every 12 months for any woman on birth control, regardless of sexual activity PSA Men 104-54 years old with risk factors Additional screening may be recommended from age 76-69 based on risk factors, symptoms, and history  Vaccine Recommendations Tetanus Booster All adults every 10 years Flu Vaccine All patients 6 months and older every year COVID Vaccine All patients 12 years and older Initial dosing with booster May recommend additional booster based on age and health history HPV Vaccine 2 doses all patients age 61-26 Dosing may be considered for patients over 26 Shingles Vaccine (Shingrix) 2 doses all adults 55 years and older Pneumonia (Pneumovax 23) All adults 65 years and older May recommend earlier dosing based on health history One year apart from Prevnar 54 Pneumonia (Prevnar 47) All adults 65 years and older Dosed 1 year after Pneumovax 23 Pneumonia (Prevnar 20) One time alternative to the two dosing of 13 and 23 For all adults with initial dose of 23, 20 is recommended 1 year later For all adults with initial dose of 13, 23 is still recommended as second option 1 year later

## 2024-01-11 NOTE — Assessment & Plan Note (Signed)
 CPE completed today. Review of HM activities and recommendations discussed and provided on AVS. Anticipatory guidance, diet, and exercise recommendations provided. Medications, allergies, and hx reviewed and updated as necessary. Orders placed as listed below.  Plan: - Labs ordered or reviewed from last visit. Will make changes as necessary based on results.  - I will review these results and send recommendations via MyChart or a telephone call.  - F/U with CPE in 1 year or sooner for acute/chronic health needs as directed.

## 2024-01-11 NOTE — Assessment & Plan Note (Signed)
 Well controlled at this time with diet and exercise, weight loss, and Ozempic . No concerns at this time. Recommend continue current management.

## 2024-01-11 NOTE — Patient Instructions (Signed)

## 2024-01-11 NOTE — Assessment & Plan Note (Signed)
 Recent flare-up possibly due to stress related to her sister's health. Clobetasol  cream effectively manages blisters. Aware of the need to manage stress to prevent flare-ups. - [facility]

## 2024-01-11 NOTE — Assessment & Plan Note (Signed)
 Uses trazodone  as needed for sleep, particularly during stressful periods related to her sister's transition to hospice care. Medication is effective and not required nightly.

## 2024-01-11 NOTE — Assessment & Plan Note (Signed)
 Improved energy levels following initial B12 injection. Injections preferred due to superior absorption and compliance compared to oral supplements, which are less effective due to poor gastrointestinal absorption. Prefers injections due to difficulty remembering daily medications. - Administer second B12 injection today - Recheck B12 levels after a couple of months of injections

## 2024-01-11 NOTE — Assessment & Plan Note (Signed)
 Blood pressures are well controlled with no concerning symptoms. Plan to continue to work with diet and exercise management. No medication at this time.

## 2024-01-11 NOTE — Assessment & Plan Note (Signed)
 Cholesterol levels improved in May labs. Actively managing cholesterol through diet, though reports difficulty maintaining protein intake. Cholesterol levels do not require rechecking today. Will check annually.

## 2024-02-05 ENCOUNTER — Other Ambulatory Visit

## 2024-02-05 DIAGNOSIS — E538 Deficiency of other specified B group vitamins: Secondary | ICD-10-CM | POA: Diagnosis not present

## 2024-02-05 MED ORDER — CYANOCOBALAMIN 1000 MCG/ML IJ SOLN
1000.0000 ug | Freq: Once | INTRAMUSCULAR | Status: AC
  Administered 2024-02-05: 1000 ug via INTRAMUSCULAR

## 2024-02-11 ENCOUNTER — Other Ambulatory Visit

## 2024-02-26 ENCOUNTER — Other Ambulatory Visit (HOSPITAL_COMMUNITY): Payer: Self-pay

## 2024-02-26 ENCOUNTER — Telehealth: Payer: Self-pay

## 2024-02-26 NOTE — Telephone Encounter (Signed)
 Pharmacy Patient Advocate Encounter  Received request via [facility] verification completed.   The patient is insured through [facility] ADVANTAGE/RX ADVANCE   Ran test claim for Ozempic  2 8mg /63ml. Currently a quantity of 3 is a 28 day supply and the co-pay is 18.00 .  No Prior Authorization is needed VIA T/C      This test claim was processed through [facility]- copay amounts may vary at other pharmacies due to pharmacy/plan contracts, or as the patient moves through the different stages of their insurance plan.

## 2024-03-02 ENCOUNTER — Other Ambulatory Visit (HOSPITAL_COMMUNITY): Payer: Self-pay

## 2024-03-04 ENCOUNTER — Telehealth: Payer: Self-pay

## 2024-03-04 NOTE — Telephone Encounter (Signed)
 Copied from CRM [id number]. Topic: Clinical - Prescription Issue >> Mar 04, 2024 10:03 AM Debby BROCKS wrote: Reason for CRM: Patient states that she received a message that her Semaglutide , 2 MG/DOSE, (OZEMPIC , 2 MG/DOSE,) 8 MG/3ML SOPN  was denied by her insurance. She states this has happened before and that her PCP (NP Camie Doing) needs to issue a letter once more so that it can get approved

## 2024-03-07 ENCOUNTER — Other Ambulatory Visit (INDEPENDENT_AMBULATORY_CARE_PROVIDER_SITE_OTHER)

## 2024-03-07 DIAGNOSIS — E538 Deficiency of other specified B group vitamins: Secondary | ICD-10-CM

## 2024-03-07 MED ORDER — CYANOCOBALAMIN 1000 MCG/ML IJ SOLN
1000.0000 ug | Freq: Once | INTRAMUSCULAR | Status: AC
  Administered 2024-03-07: 1000 ug via INTRAMUSCULAR

## 2024-03-09 ENCOUNTER — Telehealth: Payer: Self-pay

## 2024-03-09 ENCOUNTER — Other Ambulatory Visit (HOSPITAL_COMMUNITY): Payer: Self-pay

## 2024-03-09 DIAGNOSIS — I10 Essential (primary) hypertension: Secondary | ICD-10-CM

## 2024-03-09 DIAGNOSIS — R7303 Prediabetes: Secondary | ICD-10-CM

## 2024-03-09 DIAGNOSIS — E782 Mixed hyperlipidemia: Secondary | ICD-10-CM

## 2024-03-09 NOTE — Telephone Encounter (Signed)
 Ozempic Margaret Ramirez is approved exclusively as an adjunct to diet and exercise to improve glycemic control in adults with type 2 diabetes mellitus. A review of patient's medical chart reveals no  documented diagnosis of type 2 diabetes or an A1C indicative of diabetes. Therefore, they do not currently meet the criteria for prior authorization of this medication. If clinically appropriate, alternative  options such as Saxenda, Zepbound, or Wegovy  may be considered for this patient.   At this time I do not see a documented lab A1C of greater than or equal to 6.5.

## 2024-03-09 NOTE — Telephone Encounter (Signed)
 Hi Adam,                    Please see encounter from 9/19, as Camie and Leita are communicating about this now. But without an A1C of 6.5 or greater theres not a P/A to submit and therefore an appeal couldn't be done either. Unfortunately Insurance has become more stringent in their requirements for glp1 medications.

## 2024-03-09 NOTE — Telephone Encounter (Signed)
 Looks like she was originally placed on Ozempic  04/23/21 for Prediabetes when insurance was approving for this diagonosis but then insurance changed to only covering for Type 2 Diabetes.  We tried prior authorizations but was denied, stating only coverage is for Type 2 Diabetes.  If pt had other serious co-morbidities like heart attack, stroke etc could try an appeal.

## 2024-03-09 NOTE — Telephone Encounter (Signed)
 Copied from CRM #[id number]. Topic: Clinical - Medication Prior Auth >> Mar 09, 2024  9:05 AM Margaret Ramirez wrote: Reason for CRM: Patient needs prior authorization to get her prescription for Semaglutide , 2 MG/DOSE, (OZEMPIC , 2 MG/DOSE,) 8 MG/3ML SOPN.   Patient can be reached at [phone]

## 2024-03-09 NOTE — Telephone Encounter (Signed)
 Request:     I have informed the office and contacted the pts Insurance at reference# [id number]. The pt was eligible years ago for Ozempic  due to different criteria. However the pt must now have a documented A1C OF 6.5 or greater, which she doesn't and therefore a P/A cannot be processed at this time.

## 2024-03-11 MED ORDER — SEMAGLUTIDE-WEIGHT MANAGEMENT 2.4 MG/0.75ML ~~LOC~~ SOAJ
2.4000 mg | SUBCUTANEOUS | 1 refills | Status: AC
Start: 2024-03-11 — End: ?

## 2024-03-11 NOTE — Telephone Encounter (Signed)
 Called pt and informed and advised Rx sent in , she doesn't know if her insurance will cover

## 2024-03-11 NOTE — Telephone Encounter (Signed)
 Please let her know that Ozempic  is no longer covered under her plan. I have sent Wegovy  to see if we can get approval for that.

## 2024-03-11 NOTE — Addendum Note (Signed)
 Addended by: Lockie Bothun, CAMIE E on: 03/11/2024 01:37 PM   Modules accepted: Orders

## 2024-03-14 ENCOUNTER — Other Ambulatory Visit (HOSPITAL_COMMUNITY): Payer: Self-pay

## 2024-03-14 ENCOUNTER — Encounter: Payer: Self-pay | Admitting: Nurse Practitioner

## 2024-03-14 ENCOUNTER — Telehealth: Payer: Self-pay

## 2024-03-14 NOTE — Telephone Encounter (Signed)
 Pharmacy Patient Advocate Encounter   Received notification from Patient Advice Request messages that prior authorization for Wegovy  2.4MG /0.75ML auto-injectors  is required/requested.   Insurance verification completed.   The patient is insured through [facility] .   Per test claim: PA required; PA submitted to above mentioned insurance via Latent Key/confirmation #/EOC [id number] Status is pending

## 2024-03-15 ENCOUNTER — Other Ambulatory Visit (HOSPITAL_COMMUNITY): Payer: Self-pay

## 2024-03-15 NOTE — Telephone Encounter (Signed)
 Pharmacy Patient Advocate Encounter  Received notification from [facility] that Prior Authorization for Wegovy  2.4MG /0.75ML auto-injectors has been APPROVED to 4.27.26. Ran test claim, Copay is $RTS, RX LAST FILLED AS OF 9.29.25. This test claim was processed through [facility]- copay amounts may vary at other pharmacies due to pharmacy/plan contracts, or as the patient moves through the different stages of their insurance plan.   PA #/Case ID/Reference #: [id number]

## 2024-04-06 ENCOUNTER — Other Ambulatory Visit

## 2024-04-06 DIAGNOSIS — E538 Deficiency of other specified B group vitamins: Secondary | ICD-10-CM

## 2024-04-06 DIAGNOSIS — Z23 Encounter for immunization: Secondary | ICD-10-CM

## 2024-04-06 MED ORDER — CYANOCOBALAMIN 1000 MCG/ML IJ SOLN
1000.0000 ug | Freq: Once | INTRAMUSCULAR | Status: AC
  Administered 2024-04-06: 1000 ug via INTRAMUSCULAR

## 2024-05-09 ENCOUNTER — Other Ambulatory Visit: Payer: Self-pay

## 2024-05-10 ENCOUNTER — Other Ambulatory Visit (INDEPENDENT_AMBULATORY_CARE_PROVIDER_SITE_OTHER)

## 2024-05-10 DIAGNOSIS — E538 Deficiency of other specified B group vitamins: Secondary | ICD-10-CM | POA: Diagnosis not present

## 2024-05-10 MED ORDER — CYANOCOBALAMIN 1000 MCG/ML IJ SOLN
1000.0000 ug | Freq: Once | INTRAMUSCULAR | Status: AC
  Administered 2024-05-10: 1000 ug via INTRAMUSCULAR

## 2024-05-10 NOTE — Progress Notes (Signed)
 Patient is in office today for a nurse visit for B12 Injection. Patient Injection was given in the  Left deltoid. Patient tolerated injection well.

## 2024-05-10 NOTE — Addendum Note (Signed)
 Addended by: JOHNNYE JON SQUIBB on: 05/10/2024 08:40 AM   Modules accepted: Orders

## 2024-06-22 ENCOUNTER — Other Ambulatory Visit: Payer: Self-pay | Admitting: Nurse Practitioner

## 2024-06-22 DIAGNOSIS — E538 Deficiency of other specified B group vitamins: Secondary | ICD-10-CM

## 2024-06-22 DIAGNOSIS — E559 Vitamin D deficiency, unspecified: Secondary | ICD-10-CM

## 2024-07-14 ENCOUNTER — Encounter: Payer: Self-pay | Admitting: Nurse Practitioner

## 2024-07-14 ENCOUNTER — Ambulatory Visit: Payer: Self-pay | Admitting: Nurse Practitioner

## 2024-07-14 VITALS — BP 122/80 | HR 72 | Wt 180.2 lb

## 2024-07-14 DIAGNOSIS — F5101 Primary insomnia: Secondary | ICD-10-CM | POA: Diagnosis not present

## 2024-07-14 DIAGNOSIS — R7303 Prediabetes: Secondary | ICD-10-CM

## 2024-07-14 DIAGNOSIS — E66812 Obesity, class 2: Secondary | ICD-10-CM

## 2024-07-14 DIAGNOSIS — E538 Deficiency of other specified B group vitamins: Secondary | ICD-10-CM | POA: Diagnosis not present

## 2024-07-14 DIAGNOSIS — E782 Mixed hyperlipidemia: Secondary | ICD-10-CM | POA: Diagnosis not present

## 2024-07-14 DIAGNOSIS — E559 Vitamin D deficiency, unspecified: Secondary | ICD-10-CM

## 2024-07-14 DIAGNOSIS — Z6837 Body mass index (BMI) 37.0-37.9, adult: Secondary | ICD-10-CM | POA: Diagnosis not present

## 2024-07-14 DIAGNOSIS — I10 Essential (primary) hypertension: Secondary | ICD-10-CM | POA: Diagnosis not present

## 2024-07-14 DIAGNOSIS — Z23 Encounter for immunization: Secondary | ICD-10-CM

## 2024-07-14 MED ORDER — OMEGA-3-ACID ETHYL ESTERS 1 G PO CAPS: 1.0000 g | ORAL_CAPSULE | Freq: Two times a day (BID) | ORAL | 1 refills | Status: AC

## 2024-07-14 MED ORDER — CYANOCOBALAMIN 1000 MCG/ML IJ SOLN: 1000.0000 ug | Freq: Once | INTRAMUSCULAR | Status: AC

## 2024-07-14 NOTE — Assessment & Plan Note (Addendum)
 Currently managed with Lovaza . - Refilled Lovaza  prescription. Orders:   Lipid panel   omega-3 acid ethyl esters (LOVAZA ) 1 g capsule; Take 1 capsule (1 g total) by mouth 2 (two) times daily.

## 2024-07-14 NOTE — Progress Notes (Signed)
 "  Margaret Doing, DNP, AGNP-c [facility]  [address] [place], [place] [address] [phone]   ESTABLISHED PATIENT- Chronic Health and/or Follow-Up Visit on 07/14/2024  Blood pressure 122/80, pulse 72, weight 180 lb 3.2 oz (81.7 kg), last menstrual period 11/19/2008.   Subjective:  Medical Management of Chronic Issues (F/u on med check, f/u Wegovy  pt. Stats it makes her nausea first few days after taking it, pt. Wanted to get B-12 injection today, I did not give it to her yet incase you wanted to check that level first in her blood work. Pt. Did get prevnar 20 vaccine today, )  History of Present Illness Margaret Ramirez is a 64 year old female who presents for a follow-up visit.  She has gained two pounds, attributing it to water weight, and is not concerned. She is motivated to lose more weight before her son's wedding in October. She is currently on Wegovy  2.4 mg and experiences nausea after the injection, which subsides after a couple of days.  She is taking Lovaza  for hyperlipidemia, which may run out this month. She also uses Florical cream for a small peeling spot on her nose, diagnosed as actinic keratosis by her dermatologist. She plans to apply it twice daily for two weeks when not exposed to the sun.  Her diet includes normal portions, avoiding desserts, and focusing on protein-rich foods like salmon and chicken salad. She does not engage in regular exercise currently but stays busy during the summer months and walks occasionally. 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  No issues with her diet, other than normal portion control.  ROS negative except for what is listed in HPI. History, Medications, Surgery, SDOH, and Family History reviewed and updated as appropriate.  Objective:  Physical Exam Vitals and nursing note reviewed.  Constitutional:      General: She is not in acute distress.    Appearance: Normal appearance. She is normal  weight.  HENT:     Head: Normocephalic.  Eyes:     General: No scleral icterus.    Pupils: Pupils are equal, round, and reactive to light.  Neck:     Vascular: No carotid bruit.  Cardiovascular:     Rate and Rhythm: Normal rate and regular rhythm.     Pulses: Normal pulses.     Heart sounds: Normal heart sounds.  Pulmonary:     Effort: Pulmonary effort is normal.     Breath sounds: Normal breath sounds.  Abdominal:     General: Bowel sounds are normal. There is no distension.     Palpations: Abdomen is soft. There is no mass.     Tenderness: There is no abdominal tenderness. There is no guarding or rebound.  Musculoskeletal:        General: Normal range of motion.     Cervical back: Normal range of motion. No tenderness.     Right lower leg: No edema.     Left lower leg: No edema.  Skin:    General: Skin is warm and dry.  Neurological:     General: No focal deficit present.     Mental Status: She is alert and oriented to person, place, and time.     Sensory: No sensory deficit.     Motor: No weakness.     Gait: Gait normal.  Psychiatric:        Mood and Affect: Mood normal.         Assessment &  Plan:   Assessment & Plan Class 2 severe obesity due to excess calories with serious comorbidity and body mass index (BMI) of 37.0 to 37.9 in adult Experiencing nausea after Wegovy  injections, likely due to accumulation of the medication in the system. Considering adjusting the dosing schedule to manage side effects. Diet and exercise regimen continues. Great weight loss with regimen and healthy lifestyle. Current BMI 28. - Continue Wegovy  2.4 mg but extend the dosing interval to every 10 days to reduce nausea. - Encouraged increasing physical activity, aiming for 10-20 minutes of moderate intensity cardiovascular exercise daily.  Orders:   Hemoglobin A1c   CBC with Differential/Platelet   Comprehensive metabolic panel with GFR   Lipid panel  Need for vaccination against  Streptococcus pneumoniae  Orders:   Pneumococcal conjugate vaccine 20-valent (Prevnar 20)  B12 deficiency Managed with B12 injections. Labs needed before administering the next injection to avoid falsely elevated levels. - Ordered labs to assess B12 levels before administering the next B12 injection. Orders:   cyanocobalamin  (VITAMIN B12) injection 1,000 mcg   Vitamin B12  Pre-diabetes Excellent control with semaglutide  for management of weight and blood sugar. No concerns at this time. Continue with diet, exercise, and medication management.  Orders:   Hemoglobin A1c   CBC with Differential/Platelet   Comprehensive metabolic panel with GFR   omega-3 acid ethyl esters (LOVAZA ) 1 g capsule; Take 1 capsule (1 g total) by mouth 2 (two) times daily.  Mixed hyperlipidemia Currently managed with Lovaza . - Refilled Lovaza  prescription. Orders:   Lipid panel   omega-3 acid ethyl esters (LOVAZA ) 1 g capsule; Take 1 capsule (1 g total) by mouth 2 (two) times daily.  Primary hypertension BP well controlled on diet and exercise regimen. No medication.     Vitamin D  deficiency Will repeat labs today for monitoring.  Orders:   Comprehensive metabolic panel with GFR   Vitamin D , 25-hydroxy  Primary insomnia Managed with trazodone  as needed. - Continue trazodone  as needed for insomnia.      Mattison Golay E Vennie Waymire, DNP, AGNP-c    "

## 2024-07-14 NOTE — Patient Instructions (Addendum)
 VISIT SUMMARY:  During your follow-up visit, we discussed your weight management, medication for hyperlipidemia, and other health concerns. You mentioned experiencing nausea after Wegovy  injections and your current diet and activity levels. We also reviewed your medications for hypertension, vitamin B12 deficiency, and insomnia.  YOUR PLAN:  -CLASS 2 OBESITY: Class 2 obesity means having a body mass index (BMI) of 35-39.9- this is your starting BMI, while you are on medication we keep the original BMI for insurance purposes. Your BMI today is down to 28!!! This is [employer]!! You have made WONDERFUL STRIDES and I am SO proud of you!! To help manage your weight and reduce nausea from Wegovy , we will extend the dosing interval to every 10 days. Additionally, you are encouraged to increase your physical activity, aiming for 10-20 minutes of moderate intensity cardiovascular exercise daily.    -MIXED HYPERLIPIDEMIA: Mixed hyperlipidemia is a condition with elevated levels of cholesterol and triglycerides in the blood. Your condition is currently managed with Lovaza , and we have refilled your prescription today. I will let you know if there are any changes needed based on your labs.  -PRIMARY HYPERTENSION: Primary hypertension is high blood pressure without a known secondary cause. Your blood pressure is well-controlled. This is fabulous!  -VITAMIN B12 DEFICIENCY: Vitamin B12 deficiency means having lower than normal levels of vitamin B12, which is important for nerve function and the production of red blood cells.  We checked your B12 levels with labs before administering the B12 injection to avoid falsely elevated levels. I will let you know if changes are needed based on this.   -PRIMARY INSOMNIA: Primary insomnia is difficulty falling or staying asleep. You are currently managing this with trazodone  as needed, and you should continue this medication as needed.    WEIGHT LOSS PLANNING Your  progress today shows:     07/14/2024    9:09 AM 01/11/2024    9:50 AM 07/14/2023    8:11 AM  Vitals with BMI  Height  5' 7   Weight 180 lbs 3 oz 177 lbs 10 oz 172 lbs 13 oz  BMI  27.81   Systolic 122 120 883  Diastolic 80 80 78  Pulse 72 80 75    For best management of weight, it is vital to balance intake versus output. This means the number of calories burned per day must be less than the calories you take in with food and drink.   I recommend trying to follow a diet with the following: Calories: 1200-1500 calories per day Carbohydrates: 150-180 grams of carbohydrates per day  Why: Gives your body enough quick fuel for cells to maintain normal function without sending them into starvation mode.  Protein: At least 90 grams of protein per day- 30 grams with each meal Why: Protein takes longer and uses more energy than carbohydrates to break down for fuel. The carbohydrates in your meals serves as quick energy sources and proteins help use some of that extra quick energy to break down to produce long term energy. This helps you not feel hungry as quickly and protein breakdown burns calories.  Water: Drink AT LEAST 64 ounces of water per day  Why: Water is essential to healthy metabolism. Water helps to fill the stomach and keep you fuller longer. Water is required for healthy digestion and filtering of waste in the body.  Fat: Limit fats in your diet- when choosing fats, choose foods with lower fats content such as lean meats (chicken, fish, turkey).  Why:  Increased fat intake leads to storage for later. Once you burn your carbohydrate energy, your body goes into fat and protein breakdown mode to help you loose weight.  Cholesterol: Fats and oils that are LIQUID at room temperature are best. Choose vegetable oils (olive oil, avocado oil, nuts). Avoid fats that are SOLID at room temperature (animal fats, processed meats). Healthy fats are often found in whole grains, beans, nuts, seeds,  and berries.  Why: Elevated cholesterol levels lead to build up of cholesterol on the inside of your blood vessels. This will eventually cause the blood vessels to become hard and can lead to high blood pressure and damage to your organs. When the blood flow is reduced, but the pressure is high from cholesterol buildup, parts of the cholesterol can break off and form clots that can go to the brain or heart leading to a stroke or heart attack.  Fiber: Increase amount of SOLUBLE the fiber in your diet. This helps to fill you up, lowers cholesterol, and helps with digestion. Some foods high in soluble fiber are oats, peas, beans, apples, carrots, barley, and citrus fruits.   Why: Fiber fills you up, helps remove excess cholesterol, and aids in healthy digestion which are all very important in weight management.   I recommend the following as a minimum activity routine: Purposeful walk or other physical activity at least 20 minutes every single day. This means purposefully taking a walk, jog, bike, swim, treadmill, elliptical, dance, etc.  This activity should be ABOVE your normal daily activities, such as walking at work. Goal exercise should be at least 150 minutes a week- work your way up to this.   Heart Rate: Your maximum exercise heart rate should be 220 - Your Age in Years. When exercising, get your heart rate up, but avoid going over the maximum targeted heart rate.  60-70% of your maximum heart rate is where you tend to burn the most fat. To find this number:  220 - Age In Years= Max HR  Max HR x 0.6 (or 0.7) = Fat Burning HR The Fat Burning HR is your goal heart rate while working out to burn the most fat.  NEVER exercise to the point your feel lightheaded, weak, nauseated, dizzy. If you experience ANY of these symptoms- STOP exercise! Allow yourself to cool down and your heart rate to come down. Then restart slower next time.  If at ANY TIME you feel chest pain or chest pressure during  exercise, STOP IMMEDIATELY and seek medical attention.

## 2024-07-14 NOTE — Assessment & Plan Note (Addendum)
 BP well controlled on diet and exercise regimen. No medication.

## 2024-07-14 NOTE — Assessment & Plan Note (Addendum)
 Excellent control with semaglutide  for management of weight and blood sugar. No concerns at this time. Continue with diet, exercise, and medication management.  Orders:   Hemoglobin A1c   CBC with Differential/Platelet   Comprehensive metabolic panel with GFR   omega-3 acid ethyl esters (LOVAZA ) 1 g capsule; Take 1 capsule (1 g total) by mouth 2 (two) times daily.

## 2024-07-14 NOTE — Assessment & Plan Note (Deleted)
" °  Orders:   omega-3 acid ethyl esters (LOVAZA ) 1 g capsule; Take 1 capsule (1 g total) by mouth 2 (two) times daily.  "

## 2024-07-14 NOTE — Assessment & Plan Note (Addendum)
 Managed with trazodone  as needed. - Continue trazodone  as needed for insomnia.

## 2024-07-14 NOTE — Assessment & Plan Note (Addendum)
 Managed with B12 injections. Labs needed before administering the next injection to avoid falsely elevated levels. - Ordered labs to assess B12 levels before administering the next B12 injection. Orders:   cyanocobalamin  (VITAMIN B12) injection 1,000 mcg   Vitamin B12

## 2024-07-14 NOTE — Assessment & Plan Note (Addendum)
 Experiencing nausea after Wegovy  injections, likely due to accumulation of the medication in the system. Considering adjusting the dosing schedule to manage side effects. Diet and exercise regimen continues. Great weight loss with regimen and healthy lifestyle. Current BMI 28. - Continue Wegovy  2.4 mg but extend the dosing interval to every 10 days to reduce nausea. - Encouraged increasing physical activity, aiming for 10-20 minutes of moderate intensity cardiovascular exercise daily.  Orders:   Hemoglobin A1c   CBC with Differential/Platelet   Comprehensive metabolic panel with GFR   Lipid panel

## 2024-07-15 ENCOUNTER — Ambulatory Visit: Payer: Self-pay | Admitting: Nurse Practitioner

## 2024-07-15 LAB — CBC WITH DIFFERENTIAL/PLATELET
Basophils Absolute: 0.1 10*3/uL (ref 0.0–0.2)
Basos: 2 %
EOS (ABSOLUTE): 0.2 10*3/uL (ref 0.0–0.4)
Eos: 3 %
Hematocrit: 36.5 % (ref 34.0–46.6)
Hemoglobin: 12 g/dL (ref 11.1–15.9)
Immature Grans (Abs): 0 10*3/uL (ref 0.0–0.1)
Immature Granulocytes: 0 %
Lymphocytes Absolute: 1.6 10*3/uL (ref 0.7–3.1)
Lymphs: 34 %
MCH: 29 pg (ref 26.6–33.0)
MCHC: 32.9 g/dL (ref 31.5–35.7)
MCV: 88 fL (ref 79–97)
Monocytes Absolute: 0.4 10*3/uL (ref 0.1–0.9)
Monocytes: 8 %
Neutrophils Absolute: 2.5 10*3/uL (ref 1.4–7.0)
Neutrophils: 53 %
Platelets: 305 10*3/uL (ref 150–450)
RBC: 4.14 x10E6/uL (ref 3.77–5.28)
RDW: 13.1 % (ref 11.7–15.4)
WBC: 4.7 10*3/uL (ref 3.4–10.8)

## 2024-07-15 LAB — HEMOGLOBIN A1C
Est. average glucose Bld gHb Est-mCnc: 111 mg/dL
Hgb A1c MFr Bld: 5.5 % (ref 4.8–5.6)

## 2024-07-15 LAB — COMPREHENSIVE METABOLIC PANEL WITH GFR
ALT: 8 [IU]/L (ref 0–32)
AST: 12 [IU]/L (ref 0–40)
Albumin: 4.2 g/dL (ref 3.9–4.9)
Alkaline Phosphatase: 71 [IU]/L (ref 49–135)
BUN/Creatinine Ratio: 22 (ref 12–28)
BUN: 16 mg/dL (ref 8–27)
Bilirubin Total: 0.4 mg/dL (ref 0.0–1.2)
CO2: 22 mmol/L (ref 20–29)
Calcium: 9.3 mg/dL (ref 8.7–10.3)
Chloride: 102 mmol/L (ref 96–106)
Creatinine, Ser: 0.73 mg/dL (ref 0.57–1.00)
Globulin, Total: 2.8 g/dL (ref 1.5–4.5)
Glucose: 89 mg/dL (ref 70–99)
Potassium: 4.2 mmol/L (ref 3.5–5.2)
Sodium: 139 mmol/L (ref 134–144)
Total Protein: 7 g/dL (ref 6.0–8.5)
eGFR: 92 mL/min/{1.73_m2}

## 2024-07-15 LAB — LIPID PANEL
Chol/HDL Ratio: 3.5 ratio (ref 0.0–4.4)
Cholesterol, Total: 247 mg/dL — ABNORMAL HIGH (ref 100–199)
HDL: 71 mg/dL
LDL Chol Calc (NIH): 166 mg/dL — ABNORMAL HIGH (ref 0–99)
Triglycerides: 62 mg/dL (ref 0–149)
VLDL Cholesterol Cal: 10 mg/dL (ref 5–40)

## 2024-07-15 LAB — VITAMIN D 25 HYDROXY (VIT D DEFICIENCY, FRACTURES): Vit D, 25-Hydroxy: 24 ng/mL — ABNORMAL LOW (ref 30.0–100.0)

## 2024-07-15 LAB — VITAMIN B12: Vitamin B-12: 497 pg/mL (ref 232–1245)

## 2025-01-23 ENCOUNTER — Encounter: Payer: Self-pay | Admitting: Nurse Practitioner
